# Patient Record
Sex: Female | Born: 1935 | Race: White | Hispanic: No | Smoking: Never smoker
Health system: Southern US, Community
[De-identification: ages and names within clinical notes are randomized; demographics above are authoritative.]

## PROBLEM LIST (undated history)

## (undated) DIAGNOSIS — G8929 Other chronic pain: Secondary | ICD-10-CM

## (undated) DIAGNOSIS — L853 Xerosis cutis: Secondary | ICD-10-CM

## (undated) DIAGNOSIS — Z8739 Personal history of other diseases of the musculoskeletal system and connective tissue: Secondary | ICD-10-CM

## (undated) DIAGNOSIS — I10 Essential (primary) hypertension: Secondary | ICD-10-CM

## (undated) DIAGNOSIS — M419 Scoliosis, unspecified: Secondary | ICD-10-CM

## (undated) DIAGNOSIS — E039 Hypothyroidism, unspecified: Secondary | ICD-10-CM

## (undated) DIAGNOSIS — R011 Cardiac murmur, unspecified: Secondary | ICD-10-CM

## (undated) DIAGNOSIS — C801 Malignant (primary) neoplasm, unspecified: Secondary | ICD-10-CM

## (undated) DIAGNOSIS — M1990 Unspecified osteoarthritis, unspecified site: Secondary | ICD-10-CM

## (undated) DIAGNOSIS — R1310 Dysphagia, unspecified: Secondary | ICD-10-CM

## (undated) DIAGNOSIS — M21371 Foot drop, right foot: Secondary | ICD-10-CM

## (undated) DIAGNOSIS — R32 Unspecified urinary incontinence: Secondary | ICD-10-CM

## (undated) DIAGNOSIS — M21372 Foot drop, left foot: Secondary | ICD-10-CM

## (undated) DIAGNOSIS — H532 Diplopia: Secondary | ICD-10-CM

## (undated) DIAGNOSIS — M25569 Pain in unspecified knee: Secondary | ICD-10-CM

## (undated) DIAGNOSIS — M51369 Other intervertebral disc degeneration, lumbar region without mention of lumbar back pain or lower extremity pain: Secondary | ICD-10-CM

## (undated) DIAGNOSIS — R269 Unspecified abnormalities of gait and mobility: Secondary | ICD-10-CM

## (undated) DIAGNOSIS — M47817 Spondylosis without myelopathy or radiculopathy, lumbosacral region: Secondary | ICD-10-CM

## (undated) DIAGNOSIS — K219 Gastro-esophageal reflux disease without esophagitis: Secondary | ICD-10-CM

## (undated) DIAGNOSIS — G63 Polyneuropathy in diseases classified elsewhere: Principal | ICD-10-CM

## (undated) DIAGNOSIS — R42 Dizziness and giddiness: Secondary | ICD-10-CM

## (undated) DIAGNOSIS — H409 Unspecified glaucoma: Secondary | ICD-10-CM

## (undated) DIAGNOSIS — K5900 Constipation, unspecified: Secondary | ICD-10-CM

## (undated) DIAGNOSIS — M25519 Pain in unspecified shoulder: Secondary | ICD-10-CM

## (undated) DIAGNOSIS — M4800 Spinal stenosis, site unspecified: Secondary | ICD-10-CM

## (undated) DIAGNOSIS — M5136 Other intervertebral disc degeneration, lumbar region: Secondary | ICD-10-CM

## (undated) HISTORY — DX: Foot drop, right foot: M21.371

## (undated) HISTORY — DX: Spondylosis without myelopathy or radiculopathy, lumbosacral region: M47.817

## (undated) HISTORY — DX: Other chronic pain: G89.29

## (undated) HISTORY — DX: Unspecified abnormalities of gait and mobility: R26.9

## (undated) HISTORY — PX: ESOPHAGEAL DILATION: SHX303

## (undated) HISTORY — DX: Unspecified osteoarthritis, unspecified site: M19.90

## (undated) HISTORY — PX: CATARACT EXTRACTION: SUR2

## (undated) HISTORY — DX: Pain in unspecified knee: M25.569

## (undated) HISTORY — DX: Spinal stenosis, site unspecified: M48.00

## (undated) HISTORY — DX: Polyneuropathy in diseases classified elsewhere: G63

## (undated) HISTORY — DX: Scoliosis, unspecified: M41.9

## (undated) HISTORY — PX: SHOULDER SURGERY: SHX246

## (undated) HISTORY — DX: Hypothyroidism, unspecified: E03.9

## (undated) HISTORY — DX: Foot drop, left foot: M21.372

## (undated) HISTORY — DX: Cardiac murmur, unspecified: R01.1

## (undated) HISTORY — DX: Pain in unspecified shoulder: M25.519

## (undated) HISTORY — PX: INGUINAL HERNIA REPAIR: SUR1180

## (undated) HISTORY — PX: DILATION AND CURETTAGE OF UTERUS: SHX78

---

## 1989-08-15 HISTORY — PX: TOTAL ABDOMINAL HYSTERECTOMY: SHX209

## 2008-01-05 ENCOUNTER — Inpatient Hospital Stay (HOSPITAL_COMMUNITY): Admission: RE | Admit: 2008-01-05 | Discharge: 2008-01-10 | Payer: Self-pay | Admitting: Orthopedic Surgery

## 2009-02-20 ENCOUNTER — Ambulatory Visit: Payer: Self-pay | Admitting: Internal Medicine

## 2009-02-20 ENCOUNTER — Encounter: Payer: Self-pay | Admitting: Internal Medicine

## 2009-02-20 ENCOUNTER — Inpatient Hospital Stay (HOSPITAL_COMMUNITY): Admission: EM | Admit: 2009-02-20 | Discharge: 2009-02-23 | Payer: Self-pay | Admitting: Emergency Medicine

## 2009-02-25 ENCOUNTER — Telehealth (INDEPENDENT_AMBULATORY_CARE_PROVIDER_SITE_OTHER): Payer: Self-pay

## 2009-02-25 ENCOUNTER — Encounter: Payer: Self-pay | Admitting: Internal Medicine

## 2009-03-23 ENCOUNTER — Encounter: Payer: Self-pay | Admitting: Internal Medicine

## 2009-05-09 ENCOUNTER — Inpatient Hospital Stay (HOSPITAL_COMMUNITY): Admission: EM | Admit: 2009-05-09 | Discharge: 2009-05-18 | Payer: Self-pay | Admitting: Emergency Medicine

## 2009-05-13 ENCOUNTER — Encounter: Payer: Self-pay | Admitting: Internal Medicine

## 2010-09-05 ENCOUNTER — Encounter: Payer: Self-pay | Admitting: Internal Medicine

## 2010-11-18 LAB — CBC
HCT: 36.3 % (ref 36.0–46.0)
Hemoglobin: 12.2 g/dL (ref 12.0–15.0)
Hemoglobin: 12.2 g/dL (ref 12.0–15.0)
RBC: 3.87 MIL/uL (ref 3.87–5.11)
RBC: 3.92 MIL/uL (ref 3.87–5.11)
RDW: 13.8 % (ref 11.5–15.5)
RDW: 14.4 % (ref 11.5–15.5)

## 2010-11-18 LAB — BASIC METABOLIC PANEL
Calcium: 9.2 mg/dL (ref 8.4–10.5)
Creatinine, Ser: 0.64 mg/dL (ref 0.4–1.2)
GFR calc Af Amer: >60 mL/min (ref >60)
GFR calc Af Amer: >60 mL/min (ref >60)
GFR calc non Af Amer: >60 mL/min (ref >60)
GFR calc non Af Amer: >60 mL/min (ref >60)
Glucose, Bld: 111 mg/dL — ABNORMAL HIGH (ref 70–99)
Glucose, Bld: 95 mg/dL (ref 70–99)
Potassium: 3.9 mEq/L (ref 3.5–5.1)
Sodium: 136 mEq/L (ref 135–145)
Sodium: 137 mEq/L (ref 135–145)

## 2010-11-18 LAB — DIFFERENTIAL
Basophils Absolute: 0 10*3/uL (ref 0.0–0.1)
Basophils Relative: 1 % (ref 0–1)
Lymphocytes Relative: 29 % (ref 12–46)
Lymphocytes Relative: 34 % (ref 12–46)
Lymphs Abs: 2.7 10*3/uL (ref 0.7–4.0)
Monocytes Absolute: 0.5 10*3/uL (ref 0.1–1.0)
Monocytes Absolute: 0.7 10*3/uL (ref 0.1–1.0)
Monocytes Relative: 8 % (ref 3–12)
Neutro Abs: 4.4 10*3/uL (ref 1.7–7.7)
Neutro Abs: 4.6 10*3/uL (ref 1.7–7.7)
Neutrophils Relative %: 62 % (ref 43–77)

## 2010-11-18 LAB — VANCOMYCIN, TROUGH: Vancomycin Tr: 5.4 ug/mL — ABNORMAL LOW (ref 10.0–20.0)

## 2010-11-19 LAB — COMPREHENSIVE METABOLIC PANEL
AST: 14 U/L (ref 0–37)
Albumin: 2.8 g/dL — ABNORMAL LOW (ref 3.5–5.2)
Albumin: 3.1 g/dL — ABNORMAL LOW (ref 3.5–5.2)
Alkaline Phosphatase: 51 U/L (ref 39–117)
BUN: 16 mg/dL (ref 6–23)
BUN: 20 mg/dL (ref 6–23)
Calcium: 9 mg/dL (ref 8.4–10.5)
Chloride: 99 mEq/L (ref 96–112)
Creatinine, Ser: 0.8 mg/dL (ref 0.4–1.2)
Creatinine, Ser: 0.93 mg/dL (ref 0.4–1.2)
GFR calc Af Amer: >60 mL/min (ref >60)
Glucose, Bld: 111 mg/dL — ABNORMAL HIGH (ref 70–99)
Potassium: 3.5 mEq/L (ref 3.5–5.1)
Total Bilirubin: 0.3 mg/dL (ref 0.3–1.2)
Total Bilirubin: 0.7 mg/dL (ref 0.3–1.2)
Total Protein: 6.1 g/dL (ref 6.0–8.3)
Total Protein: 6.9 g/dL (ref 6.0–8.3)

## 2010-11-19 LAB — BASIC METABOLIC PANEL
CO2: 27 mEq/L (ref 19–32)
Calcium: 8.9 mg/dL (ref 8.4–10.5)
Chloride: 103 mEq/L (ref 96–112)
Creatinine, Ser: 0.75 mg/dL (ref 0.4–1.2)
GFR calc Af Amer: >60 mL/min (ref >60)
GFR calc Af Amer: >60 mL/min (ref >60)
GFR calc non Af Amer: >60 mL/min (ref >60)
GFR calc non Af Amer: >60 mL/min (ref >60)
GFR calc non Af Amer: >60 mL/min (ref >60)
Glucose, Bld: 98 mg/dL (ref 70–99)
Potassium: 3.9 mEq/L (ref 3.5–5.1)
Potassium: 4.5 mEq/L (ref 3.5–5.1)
Sodium: 138 mEq/L (ref 135–145)
Sodium: 138 mEq/L (ref 135–145)

## 2010-11-19 LAB — CBC
HCT: 31.5 % — ABNORMAL LOW (ref 36.0–46.0)
HCT: 33.2 % — ABNORMAL LOW (ref 36.0–46.0)
HCT: 34.4 % — ABNORMAL LOW (ref 36.0–46.0)
HCT: 35.2 % — ABNORMAL LOW (ref 36.0–46.0)
Hemoglobin: 11.5 g/dL — ABNORMAL LOW (ref 12.0–15.0)
Hemoglobin: 11.8 g/dL — ABNORMAL LOW (ref 12.0–15.0)
Hemoglobin: 12.1 g/dL (ref 12.0–15.0)
MCHC: 34.5 g/dL (ref 30.0–36.0)
MCHC: 34.6 g/dL (ref 30.0–36.0)
MCV: 92.4 fL (ref 78.0–100.0)
MCV: 92.4 fL (ref 78.0–100.0)
MCV: 93.1 fL (ref 78.0–100.0)
Platelets: 207 10*3/uL (ref 150–400)
Platelets: 224 10*3/uL (ref 150–400)
Platelets: 323 10*3/uL (ref 150–400)
RBC: 3.57 MIL/uL — ABNORMAL LOW (ref 3.87–5.11)
RBC: 3.61 MIL/uL — ABNORMAL LOW (ref 3.87–5.11)
RBC: 3.79 MIL/uL — ABNORMAL LOW (ref 3.87–5.11)
RDW: 13.9 % (ref 11.5–15.5)
RDW: 13.9 % (ref 11.5–15.5)
RDW: 14.4 % (ref 11.5–15.5)
WBC: 10.7 10*3/uL — ABNORMAL HIGH (ref 4.0–10.5)
WBC: 8.5 10*3/uL (ref 4.0–10.5)
WBC: 8.8 10*3/uL (ref 4.0–10.5)

## 2010-11-19 LAB — DIFFERENTIAL
Basophils Absolute: 0 10*3/uL (ref 0.0–0.1)
Basophils Absolute: 0 10*3/uL (ref 0.0–0.1)
Basophils Absolute: 0 10*3/uL (ref 0.0–0.1)
Basophils Absolute: 0 10*3/uL (ref 0.0–0.1)
Basophils Relative: 0 % (ref 0–1)
Basophils Relative: 0 % (ref 0–1)
Basophils Relative: 0 % (ref 0–1)
Eosinophils Relative: 0 % (ref 0–5)
Eosinophils Relative: 1 % (ref 0–5)
Eosinophils Relative: 1 % (ref 0–5)
Lymphocytes Relative: 15 % (ref 12–46)
Lymphocytes Relative: 18 % (ref 12–46)
Lymphocytes Relative: 19 % (ref 12–46)
Lymphocytes Relative: 22 % (ref 12–46)
Lymphs Abs: 1.9 10*3/uL (ref 0.7–4.0)
Lymphs Abs: 2.1 10*3/uL (ref 0.7–4.0)
Monocytes Absolute: 0.8 10*3/uL (ref 0.1–1.0)
Monocytes Absolute: 0.9 10*3/uL (ref 0.1–1.0)
Monocytes Absolute: 1.1 10*3/uL — ABNORMAL HIGH (ref 0.1–1.0)
Monocytes Absolute: 1.3 10*3/uL — ABNORMAL HIGH (ref 0.1–1.0)
Monocytes Relative: 9 % (ref 3–12)
Neutro Abs: 7.2 10*3/uL (ref 1.7–7.7)
Neutro Abs: 7.5 10*3/uL (ref 1.7–7.7)
Neutro Abs: 9.3 10*3/uL — ABNORMAL HIGH (ref 1.7–7.7)
Neutrophils Relative %: 72 % (ref 43–77)
Neutrophils Relative %: 75 % (ref 43–77)

## 2010-11-19 LAB — C-REACTIVE PROTEIN: CRP: 17.8 mg/dL — ABNORMAL HIGH (ref <0.6)

## 2010-11-19 LAB — URINALYSIS, ROUTINE W REFLEX MICROSCOPIC
Bilirubin Urine: NEGATIVE
Nitrite: NEGATIVE
Protein, ur: 30 mg/dL — AB

## 2010-11-19 LAB — PROTIME-INR
INR: 1 (ref 0.00–1.49)
Prothrombin Time: 13.3 seconds (ref 11.6–15.2)

## 2010-11-19 LAB — BODY FLUID CULTURE

## 2010-11-19 LAB — APTT: aPTT: 42 seconds — ABNORMAL HIGH (ref 24–37)

## 2010-11-19 LAB — URINE CULTURE

## 2010-11-19 LAB — CULTURE, BLOOD (ROUTINE X 2)
Culture: NO GROWTH
Report Status: 10042010

## 2010-11-21 LAB — COMPREHENSIVE METABOLIC PANEL
AST: 25 U/L (ref 0–37)
Albumin: 3.7 g/dL (ref 3.5–5.2)
Alkaline Phosphatase: 50 U/L (ref 39–117)
BUN: 16 mg/dL (ref 6–23)
CO2: 25 mEq/L (ref 19–32)
Chloride: 101 mEq/L (ref 96–112)
Potassium: 3.3 mEq/L — ABNORMAL LOW (ref 3.5–5.1)
Total Bilirubin: 0.5 mg/dL (ref 0.3–1.2)

## 2010-11-21 LAB — CBC
HCT: 34.7 % — ABNORMAL LOW (ref 36.0–46.0)
HCT: 35 % — ABNORMAL LOW (ref 36.0–46.0)
HCT: 38.2 % (ref 36.0–46.0)
Hemoglobin: 12.1 g/dL (ref 12.0–15.0)
MCHC: 35 g/dL (ref 30.0–36.0)
Platelets: 190 10*3/uL (ref 150–400)
Platelets: 222 10*3/uL (ref 150–400)
RBC: 3.76 MIL/uL — ABNORMAL LOW (ref 3.87–5.11)
RBC: 4.18 MIL/uL (ref 3.87–5.11)
RDW: 15 % (ref 11.5–15.5)
WBC: 11.4 10*3/uL — ABNORMAL HIGH (ref 4.0–10.5)
WBC: 8.6 10*3/uL (ref 4.0–10.5)

## 2010-11-21 LAB — DIFFERENTIAL
Basophils Absolute: 0.1 10*3/uL (ref 0.0–0.1)
Basophils Relative: 1 % (ref 0–1)
Eosinophils Absolute: 0.1 10*3/uL (ref 0.0–0.7)
Eosinophils Relative: 1 % (ref 0–5)
Eosinophils Relative: 1 % (ref 0–5)
Lymphocytes Relative: 17 % (ref 12–46)
Lymphocytes Relative: 23 % (ref 12–46)
Lymphs Abs: 2 10*3/uL (ref 0.7–4.0)
Monocytes Absolute: 0.8 10*3/uL (ref 0.1–1.0)
Monocytes Absolute: 0.8 10*3/uL (ref 0.1–1.0)
Monocytes Relative: 8 % (ref 3–12)
Neutrophils Relative %: 67 % (ref 43–77)

## 2010-11-21 LAB — BASIC METABOLIC PANEL
BUN: 6 mg/dL (ref 6–23)
BUN: 7 mg/dL (ref 6–23)
CO2: 28 mEq/L (ref 19–32)
Calcium: 9.7 mg/dL (ref 8.4–10.5)
Chloride: 106 mEq/L (ref 96–112)
Chloride: 107 mEq/L (ref 96–112)
Creatinine, Ser: 0.73 mg/dL (ref 0.4–1.2)
Creatinine, Ser: 0.79 mg/dL (ref 0.4–1.2)
GFR calc Af Amer: >60 mL/min (ref >60)
GFR calc non Af Amer: >60 mL/min (ref >60)
GFR calc non Af Amer: >60 mL/min (ref >60)
Glucose, Bld: 117 mg/dL — ABNORMAL HIGH (ref 70–99)
Glucose, Bld: 98 mg/dL (ref 70–99)
Potassium: 3.4 mEq/L — ABNORMAL LOW (ref 3.5–5.1)
Potassium: 4 mEq/L (ref 3.5–5.1)
Sodium: 138 mEq/L (ref 135–145)

## 2010-11-21 LAB — HEPATIC FUNCTION PANEL
ALT: 11 U/L (ref 0–35)
Bilirubin, Direct: 0.1 mg/dL (ref 0.0–0.3)
Indirect Bilirubin: 0.4 mg/dL (ref 0.3–0.9)

## 2010-11-21 LAB — HEMOGLOBIN AND HEMATOCRIT, BLOOD
HCT: 34 % — ABNORMAL LOW (ref 36.0–46.0)
Hemoglobin: 11.6 g/dL — ABNORMAL LOW (ref 12.0–15.0)

## 2010-11-21 LAB — LIPID PANEL
Cholesterol: 170 mg/dL (ref 0–200)
HDL: 40 mg/dL (ref >39)
LDL Cholesterol: 103 mg/dL — ABNORMAL HIGH (ref 0–99)
Total CHOL/HDL Ratio: 4.3 RATIO
VLDL: 27 mg/dL (ref 0–40)

## 2010-11-21 LAB — SAMPLE TO BLOOD BANK

## 2010-12-28 NOTE — Consult Note (Signed)
NAMEDAVY, FAUGHT                ACCOUNT NO.:  [id number]   MEDICAL RECORD NO.:  [id number]          PATIENT TYPE:  INP   LOCATION:  A306                          FACILITY:  [facility]   PHYSICIAN:  R. Roetta Sessions, M.D. DATE OF BIRTH:  11/11/1924   DATE OF CONSULTATION:  02/20/2009  DATE OF DISCHARGE:                                 CONSULTATION   REASON FOR CONSULTATION:  Abdominal cramps and bloody diarrhea.   HISTORY OF PRESENT ILLNESS:  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  She has not had any  melena.  She was seen by Dr. Lynelle Doctor in the emergency department, where  she was evaluated.  She was found be hemodynamically stable.  She had no  stool in the rectal vault.  The CBC revealed a white count of 11.4, H  and H of 13.3 and 38.3, MCV 91.5, platelet count 222,000, INR 0.9.   [name] denies any prior similar symptoms.  She reported undergoing  her first ever screening colonoscopy 3 years ago by Dr. Angela Nevin up in  [place], was told that everything looked good.  She does take Excedrin  Migraine on occasion, ibuprofen for various aches and pains.  She does  have chronic gastroesophageal reflux disease for which she takes  Prilosec 20 mg orally daily.  She also describes intermittent esophageal  dysphagia to solids and has to take Tums two to three times weekly for  breakthrough reflux symptoms.  She has never had her upper GI tract  evaluated.  She does not use tobacco or alcohol.   PAST MEDICAL HISTORY:  Significant for left rotator cuff.  She underwent  surgery back in 2000 by Dr. Simonne Come, down in [place].  History of  hypertension, hypothyroidism.  Glaucoma.   PAST SURGERIES:  Right inguinal hernia  repair, left rotator cuff surgery  repair previously.  No history of gastrointestinal bleeding.  History of  cataract surgery.   MEDICATIONS ON ADMISSION:  Excedrin Migraine, ibuprofen p.r.n.,  Synthroid, Moexipril, hydrochlorothiazide, ophthalmic eye drops.   ALLERGIES:  No known drug allergies.   FAMILY HISTORY:  Negative for known GI or liver illness.   SOCIAL HISTORY:  Patient is a retired Public house manager.  She is widowed.  She has had  four children.  Two are deceased.  One died with leukemia.  The other  died with kidney failure.  No tobacco, no alcohol.  She lives in an  assisted living center in [place], [place].  She has two supportive  children.   REVIEW OF SYSTEMS:  No recent chest pain, dyspnea on exertion.  Reflux  symptoms and dysphagia as outlined above.  She denies any change in  weight recently.   PHYSICAL EXAMINATION:  GENERAL:  Pleasant, conversant, well-oriented  elderly lady, resting comfortably in her hospital bed.  VITAL SIGNS:  Temperature 98.6, pulse 81,  respiratory rate 18, BP  119/56, O2 saturation 98% on room air.  SKIN:  Warm and dry.  No jaundice or cutaneous stigmata of chronic liver  disease.  HEENT:  No scleral icterus.  Conjunctivae pink.  Oral cavity no lesions.  CHEST:  Lungs clear to auscultation.  CARDIAC EXAM:     Regular rate and rhythm without murmur, gallop or rub.  BREAST EXAM:  Deferred.  ABDOMEN:  Nondistended.  Positive bowel sounds, no bruits.  She has mild  diffuse tenderness to palpation.  No appreciable mass or organomegaly.  EXTREMITIES: No edema.  RECTAL:  Exam deferred to time of colonoscopy.   LABORATORY EVALUATION:  CBC as outlined above.  Sodium 134, potassium  2.3, chloride 101, CO2 25, glucose 122, BUN 16, creatinine 0.70, total  bilirubin 0.5, alkaline phosphatase 50, AST 25, ALT 16, albumin 3.7,  calcium 9.9.  INR 0.9.   IMPRESSION:  [name] is a very pleasant 77 year old lady,  admitted to the hospital with acute  illness, characterized by abdominal  cramps, followed by multiple bowel movements, followed by blood per  rectum.   She remains hemodynamically stable, has a normal initial hemoglobin  here.   Clinically, this scenario sounds very much like a segmental or ischemic  colitis, although certainly other diagnostic possibilities would exist.  It is good to know she had a reportedly negative colonoscopy just 3  years ago.   As a separate issue, she has longstanding gastroesophageal reflux  disease symptoms and has refractory symptoms to once daily PPI therapy  in the way of Prilosec.  She also describes esophageal dysphagia, has  not had her upper GI tract evaluated previously.   RECOMMENDATIONS:  I have told [name] we ought to go ahead and do a  colonoscopy today and determine the cause of her hematochezia.  Risks,  benefits, alternatives and limitations to this approach have been  reviewed.   At some point in the future, I have also offered [name] an EGD to  further evaluate her refractory reflux symptoms and esophageal  dysphagia.  This would be best accomplished at another time, perhaps in  the outpatient setting.  Her questions have been answered.  She is  agreeable.  We will make further recommendations in the very near  future.  We will plan to prep her and perform colonoscopy later today.      Jonathon Bellows, M.D.  Electronically Signed     RMR/MEDQ  D:  02/20/2009  T:  02/20/2009  Job:  [id number]   cc:   Dr. Beckey [facility]   [facility] Hospitalist Team

## 2010-12-28 NOTE — Discharge Summary (Signed)
[name], [name]                ACCOUNT NO.:  [id number]   MEDICAL RECORD NO.:  [id number]          PATIENT TYPE:  INP   LOCATION:  1504                         [facility]ACILITY:  [facility]   PHYSICIAN:  Marlowe Kays, M.D.  DATE OF BIRTH:  1924-10-15   DATE OF ADMISSION:  01/04/2008  DATE OF DISCHARGE:  01/09/2008                               DISCHARGE SUMMARY   ADMITTING DIAGNOSES:  1. Severe tear in the face of impingement of the rotator cuff of the      left shoulder.  2. 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  3. Hypothyroidism.  4. Status post bilateral corneal transplants, abdominal hernia repair,      hysterectomy and bladder tack, and left foot surgery.   DISCHARGE DIAGNOSES:  1. Severe tear in the face of impingement of the rotator cuff of the      left shoulder.  2. 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  3. Hypothyroidism.  4. Status post bilateral corneal transplants, abdominal hernia repair,      hysterectomy and bladder tack, and left foot surgery.   OPERATION:  On Jan 04, 2008, the patient underwent:  1. Anterior acromionectomy with repair of the torn rotator cuff      utilizing TissueMend graft.  2. Open distal clavicle resection left shoulder, with nurse an extra      tech assisting.   BRIEF HISTORY:  This patient had an injury to her left shoulder about 2  months ago.  MRI was performed in [place] on Dec 20, 2007 which showed a  rotator cuff tear with more than 3 cm of retraction.  She also had  subsequent atrophy to the supra- and infraspinatus muscles.  AC joint  arthritis was also seen.  She was seen by a physician in [place], and  she was told that her shoulder was not repairable.  She had heard of Dr.  Simonne Come through a friend and came for a second opinion.  Dr. Simonne Come  carefully reviewed all the MRIs, offered her several options, and she  decided to go ahead with repair of the rotator cuff of the left  shoulder.   This is a very independent one who maintains her own home.  She has a  granddaughter who lives with her, around 75 years of age.  She has been  extremely active she is a retired Engineer, civil (consulting) and is extremely aware of the  risks and benefits of this surgery, which were fully described by Dr.  Simonne Come .   We would like to keep her as independent as possible, and since the  shoulder is given her so much trouble, it was decided to go ahead with  the above procedure.   COURSE IN THE HOSPITAL:  The patient tolerated the surgical procedure  quite well.  She had a little difficulty in recovering from anesthesia.  PCA was given and then discontinued due the lingering effects of the  analgesic.  Occupational therapy saw the patient on the first postop  day, and the patient really did not want to participate due to the  extreme pain.  The occupational therapist as well as physical therapist  strongly recommended the patient have some sort of  rehabilitation/skilled nursing for her safety as she essentially is  alone at home (her granddaughter working and goes to school).  We  directed our efforts toward skilled nursing.  Apparently, there is a  skilled nursing facility around her home in [place].  I believe she  told me it was [facility].  She highly desires to go to this area,  and I mentioned in my notes today that when we could get these  arrangements done then she can be discharged.  This morning, she was  very frustrated and wanted to continue her life.   Examination this morning reveals that the shoulder immobilizer is in  place.  The dressing is dry.  Neurovascular is intact to the left upper  extremity.  Laboratory values in the hospital hematologically showed a  hematocrit and hemoglobin within normal limits.  Blood chemistries were  normal as well.  Chest x-ray showed no active disease.  Electrocardiogram showed sinus rhythm, with occasional premature atrial   contractions.   CONDITION ON DISCHARGE:  Stable.   PLAN:  The patient will be transferred to skilled nursing.  If some sort  of arrangements can be made in her home with adequate and even constant  care, then perhaps that might be an option, but at this point in time  skilled nursing/rehab is indicated.   As far as any physical therapy and occupational therapy, she is not to  be taken out of the shoulder abductor splint.  The elbow cannot be  brought to the side.  Absolutely NO range of motion of the left  shoulder.  Dry dressing to the wound is needed.   MEDICATIONS AT DISCHARGE:  1. Tylox or Vicodin 1-2 q.4-6 h. p.r.n. pain, and she prefers Extra      Strength Excedrin.  2. Hydrochlorothiazide 25 mg q.a.m.  3. Moexipril hydrochloride 7.5 mg q.a.m.  4. Synthroid 75 mcg q.a.m.  5. Azopt eye drops 1%, 1 drop each eye b.i.d.  6. Fish oil capsule Lovaza b.i.d.  7. Prilosec over the counter q.a.m.  8. Multivitamin, vitamin C, vitamin B12 daily.  9. Excedrin p.r.n.  10.Lescol 80 mg at bedtime.  11.Tylenol p.r.n. discomfort.   She is to return to see Dr. Simonne Come 2 weeks after date of surgery.  His phone number is [phone].  Please call for an appointment.  If you  have any orthopedic questions, please contact Dr. Simonne Come.  If you  have any medical problems, please contact her physician in [place].      [name].    ______________________________  Marlowe Kays, M.D.    DLU/MEDQ  D:  01/09/2008  T:  01/09/2008  Job:  [id number]

## 2010-12-28 NOTE — Discharge Summary (Signed)
NAMEVICTOR, [name]                ACCOUNT NO.:  [id number]   MEDICAL RECORD NO.:  [id number]          PATIENT TYPE:  INP   LOCATION:  A306                          FACILITY:  [facility]   PHYSICIAN:  Ashley Shipper, MD     DATE OF BIRTH:  [birth year]   DATE OF ADMISSION:  02/20/2009  DATE OF DISCHARGE:  07/12/2010LH                               DISCHARGE SUMMARY   PRIMARY MEDICAL DOCTOR:  [name] in [place], [place].   During this hospitalization, the patient was seen by [name],  gastroenterologist.   PROCEDURES UNDERGONE:  Colonoscopy with biopsy which revealed normal  rectum, elongated tortuous colon and left-sided diverticula.  She also  was noted to have a markedly inflamed segment of the colonic mucosa  straddling the splenic flexure consistent with segmental colitis or  ischemic colitis.   DISCHARGE DIAGNOSES:  1. Segmental/ischemic colitis with hematochezia, improved.  2. Abdominal pain, chronic etiology, unclear, possible irritable bowel      syndrome or related to constipation.  3. Cholelithiasis without evidence of cholecystitis.  4. History of hypertension.  5. History hypothyroidism.   Please see the H and P dictated at the time of admission for details  regarding patient's presenting illness.   BRIEF HOSPITAL COURSE:  Hematochezia.  This is an 77 year old Caucasian  female who presented to the hospital with complaints of blood in the  stool.  The patient was admitted to the hospital and she was seen by  Gastroenterology and underwent a colonoscopy as discussed above.  Segmental colitis is a self-limiting process.  The patient stopped  having blood in the stool.  Hemoglobin did not change too much.  She did  not require any transfusions.  Hemoglobin at presentation was 13.3 and  the last one we have is 12.0.  The patient was having a lot of abdominal  pain when I saw her yesterday, mostly in the right lower quadrant, so we  did a CT abdomen and pelvis which  revealed actually gallstones and pus,  and pericholecystic fluid and they felt it could be due to  cholecystitis.  She was also noted to have small hiatal hernia,  extensive atherosclerosis, however, no significant stenosis was noted in  the mesenteric vessels.  Mild thickening of the wall of the descending  colon was noted which was felt to be residual colitis, so I went back  and reevaluated the patient after the CT.  She does not have any right  lower quadrant tenderness.  LFTs are completely normal, so I think that  she does not have cholecystitis and the gallstones are coincidental  finding or rather incidental finding of the CT.  I have explained to her  that she does have gallstones and I have told her that if she develops  dyspeptic symptoms or if develops pain in the right upper quadrant then  she needs to talk to her doctor and get herself evaluated by a surgeon.  However, at this time she does not need surgery.   Rest of her medical issues including hypothyroidism and hypertension  remains stable.   This morning,  the patient feels well, she does not have any complaints.  She had 2 bowel movements with no blood in them.   PHYSICAL EXAMINATION:  VITAL SIGNS:  Stable.  Blood pressure is 138/84,  saturation 92% on room air.  LUNGS:  Clear to auscultation bilaterally with no wheezing, rales, or  rhonchi.  CARDIOVASCULAR:  S1, S2 is normal and regular.  ABDOMEN:  Soft, nontender, nondistended.  Bowel sounds are present.  No  masses or organomegaly is appreciated.   LABS:  This morning showed normal LFTs, normal potassium, normal  creatinine.   She has been ambulating with no difficulties.   DISCHARGE MEDICATIONS:  1. Azopt 1% one drop each eye twice daily.  2. HCTZ 25 mg daily.  3. Synthroid 75 mcg daily.  4. Lexapro 7.5 mg half a tablet daily.   No new medications have been prescribed.  She may take over-the-counter  stool softners if needed for constipation.    FOLLOWUP:  1. [name] will schedule followup with his office.  2. She needs to see her PMD next week.   DIET:  Heart healthy with high fiber.   PHYSICAL ACTIVITY:  No restrictions.   IMAGING STUDIES:  CT abdomen and pelvis as described above.   Total time in this encounter, 35 minutes.      Ashley Shipper, MD  Electronically Signed     GK/MEDQ  D:  02/23/2009  T:  02/23/2009  Job:  [id number]   cc:   [name], M.D.  [address]  [place]  [place] [address]   [name]  [place]

## 2010-12-28 NOTE — Discharge Summary (Signed)
NAMETENESSA, MARSEE NO.:  [id number]   MEDICAL RECORD NO.:  [id number]          PATIENT TYPE:  INP   LOCATION:  1504                         FACILITY:  [facility]   PHYSICIAN:  Marlowe Kays, M.D.  DATE OF BIRTH:  May 29, 1925   DATE OF ADMISSION:  01/04/2008  DATE OF DISCHARGE:                               DISCHARGE SUMMARY   ADDENDUM:  We continued the search for appropriate skilled nursing  facility for Ms. Eagleton.  Her daughter has apparently selected one  according to what Ms. Schnepf said this morning.  Hopefully, this  transition will be smooth.  She is relatively comfortable at this point  in time and has continued work with therapy.   She made Dr. Simonne Come and myself aware of the weakness that had been  noticeable in her lower extremities.  Apparently her family physician  had ordered physical therapy to strengthen her lower extremities.  This  was before the surgical procedure for which she was admitted.  It  certainly is understandable why she was slow with her ambulation and  activities of daily living postoperatively.  When she is transferred to  the skilled nursing/rehab we will need to continue with strengthening  and range of motion of the lower extremities so that she can return to  the level of independence and comfort that she had preoperatively.      [name].    ______________________________  Marlowe Kays, M.D.    DLU/MEDQ  D:  01/10/2008  T:  01/10/2008  Job:  [id number]

## 2010-12-28 NOTE — Discharge Summary (Signed)
[name], [name]                ACCOUNT NO.:  [id number]   MEDICAL RECORD NO.:  [id number]          PATIENT TYPE:  INP   LOCATION:  1504                         [facility]ACILITY:  [facility]   PHYSICIAN:  Marlowe Kays, M.D.  DATE OF BIRTH:  1925-01-22   DATE OF ADMISSION:  01/04/2008  DATE OF DISCHARGE:  01/09/2008                               DISCHARGE SUMMARY   ADDENDUM:  In addition to the medications previously dictated, [name] also has  Robaxin 500 mg 1 q.6 h. p.r.n. muscle spasm.  We also want to encourage  her to relax the musculature of the trapezius muscle on the left side.  Also to note that the pillow part of the shoulder immobilizer abductor  splint, the concave side of the pillow, should be against her body to  support the arm/shoulder away from the body.      [name].    ______________________________  Marlowe Kays, M.D.    DLU/MEDQ  D:  01/09/2008  T:  01/09/2008  Job:  [id number]

## 2010-12-28 NOTE — Op Note (Signed)
[name], [name]                ACCOUNT NO.:  [id number]   MEDICAL RECORD NO.:  [id number]          PATIENT TYPE:  INP   LOCATION:  A306                          FACILITY:  [facility]   PHYSICIAN:  R. Roetta Sessions, M.D. DATE OF BIRTH:  05-26-1925   DATE OF PROCEDURE:  02/20/2009  DATE OF DISCHARGE:                               OPERATIVE REPORT   PROCEDURE:  Colonoscopy with biopsy.   INDICATIONS FOR PROCEDURE:  An 77 year old lady admitted hospital after  acute illness characterized by abdominal cramps, diarrhea, and blood per  rectum.  She remains hemodynamically stable.  Colonoscopy is now being  performed.  Risks, benefits, alternatives, and limitations have been  discussed and questions answered.  Please see the documentation in the  medical record.   PROCEDURE NOTE:  O2 saturation, blood pressure, pulse, and respiration  were monitored the entire procedure.   CONSCIOUS SEDATION:  Versed 4 mg IV and Demerol 100 mg IV in divided  doses.   INSTRUMENT:  Pentax video chip system.   FINDINGS:  Digital rectal exam revealed no abnormalities.   ENDOSCOPIC FINDINGS:  The prep was suboptimal but doable.   Colon:  Colonic mucosa was surveyed from the rectosigmoid junction  through the left transverse right colon to the appendiceal orifice,  ileocecal valve, and cecum.  These structures were well seen and  photographed for the record.  From this level, the scope was slowly and  cautiously withdrawn.  All previously mentioned mucosal surfaces were  again seen.  The patient a long tortuous colon.  The patient had left-  sided diverticula.   The patient had a markedly abnormal segment of colon, perhaps 20-30 cm  in length, straddling the splenic flexure characterized by  fibrotic-  appearing, eroded, ulcerated mucosa.  There were confluent inflammatory  changes with some areas of satellite inflammation trailing off  distally and proximally with the more proximal colon to the cecum,  otherwise appearing normal, and the more distal descending and sigmoid  segments normal aside from the previously mentioned diverticula.  Biopsies were taken.  The scope was pulled down into the rectum where  examination of the rectal mucosa was undertaken.  The rectal mucosa  appeared normal.  I attempted to retroflex but was unable to do so.  The  patient tolerated the procedure well.  Cecal withdrawal time:  7  minutes.   IMPRESSION:  1. Normal rectum.  2. Somewhat elongated tortuous colon and left-sided diverticula.  3. Markedly inflamed segment of colonic mucosa as described above      straddling the splenic flexure consistent with ischemic or      segmental colitis which is a self-limiting illness and almost never      recurs.   RECOMMENDATIONS:  1. Clear liquid diet today.  Advance to a low-residue diet starting      tomorrow.  2. Follow up on path.  3. I recommended Ms. Crail come back electively for an outpatient EGD      with esophageal dilation as appropriate to further evaluate and      manage her refractory reflux symptoms  and solid food dysphagia.   I would like to thank the hospitalist service for allowing me to see  this nice lady today.      Jonathon Bellows, M.D.  Electronically Signed     RMR/MEDQ  D:  02/20/2009  T:  02/20/2009  Job:  [id number]   cc:   Carlota Raspberry  Fax: [phone]

## 2010-12-28 NOTE — Op Note (Signed)
[name], [name]                ACCOUNT NO.:  [id number]   MEDICAL RECORD NO.:  [id number]          PATIENT TYPE:  AMB   LOCATION:  DAY                          FACILITY:  [facility]   PHYSICIAN:  Marlowe Kays, M.D.  DATE OF BIRTH:  May 14, 1925   DATE OF PROCEDURE:  01/04/2008  DATE OF DISCHARGE:                               OPERATIVE REPORT   PREOPERATIVE DIAGNOSES:  1. Torn rotator cuff.  2. Osteoarthritis, acromioclavicular joint, left shoulder.   POSTOPERATIVE DIAGNOSES:  1. Torn rotator cuff.  2. Osteoarthritis, acromioclavicular joint, left shoulder.   OPERATION:  1. Anterior acromionectomy with repair of torn rotator cuff utilizing      TissueMend graft.  2. Open distal clavicle resection, left shoulder.   SURGEON:  [place].   ASSISTANT:  Nurse and extra tech.   PATHOLOGY/JUSTIFICATION FOR PROCEDURE:  Originally injury with roughly  two months ago.  She had an MRI on Dec 20, 2007 which demonstrated a  rotator cuff tear with more than 3 cm of retraction, atrophy of supra  and infraspinatus muscles, and the AC joint arthritis.  She was seen  locally in [place] and told the repair was not fixable prompting her to  come see me for a second opinion.  The pain is a major problem, driving  her to have an attempt at repair today.  I explained preoperatively that  the quality of the repair would depend on the quality of the rotator  cuff tissue and as well as its flexibility.   PROCEDURE:  Prophylactic antibiotics, satisfactory general anesthesia,  beach-chair position on the Graham frame.  Left shoulder girdle was  prepped with DuraPrep, draped in a sterile field.  Time-out performed.  Ioban utilized.  I made an incision over the distal clavicle and then  curved vertically downward over the proximal humerus, first isolating  the distal clavicle and AC joint. I measured out a line for osteotomy at  about a centimeter and a half from the [facility] joint.  Protecting the  underlying tissues with baby Bennett's, I amputated the bone at this  level with a micro saw and moved the cut fragment with a towel clip and  cautery technique.  Bone wax was placed over the distal clavicle raw  bone.  I then extended the incision lateral ward and dissected the  fascia over the anterior acromion with cutting cautery.  I undermined  the anterior acromion, and joint fluid came forth confirming the tear.  My initial anterior acromionectomy was obliquely in usual fashion back  to the [facility] joint.  Subsequently, as need arose, I removed more anterior  bone and also some more bone at the [facility] joint for better visualization of  a rotator cuff tear.  Rotator cuff tissue was thin and not very  flexible.  There was a large anterior flap, very small posterior flap in  the middle, not much remaining except some fragmented strands.  I first  attempted to separate out the rotator cuff tissue from the overlying  tissue and acromion.  I then piecemealed together the midportion of the  rotator cuff strands  with multiple interrupted #1 Ethibond.  I then used  first of 2 Stryker anchors, the first one being 5.5 mm anteriorly and  wove four strands of the anchor through the large anterior flap.  I  cinched this down with the arm abducted on the Mayo stand and then tried  to work posteriorly.  The first 5.5 anchor pulled completely out of her  very soft humeral head.  We went and used a 6.5 Stryker anchor which did  seemed to hold securely.  I then worked from posteriorly toward the  middle, cinching down the small posterior flap which was not very  movable.  Finally, I then tried to blend the middle pieces together by  tying sutures through the posterior middle flaps together.  This was way  up at the [facility] joint.  From the anterior flap, I also through the sutures  and placed them through soft tissue over the lateral humerus which gave  some lateral buttress.  The posterior half of the rotator cuff,  I was  unable to return to its normal anatomic position, even with the arm  abducted.  I elected to use a TissueMend graft over this area and  beneath the acromion in the middle third of the fragmented flaps or  strands.  There was no place to suture the graft, so after smoothing  down, I took some triple-ply Surgicel and placed it down on top of the  graft, and this seemed to stabilize it nicely and also smoothed down the  overlying sutures.  I then irrigated the wound well with sterile saline  and infiltrated the soft tissues with 0.50% Marcaine with adrenaline.  Between the TissueMend graft and the Surgicel, no Gelfoam was required  to fill the gap at the distal clavicle resection site.  The wound was  then closed in layers with interrupted #1 Vicryl with the fascia over  the distal clavicle and acromion, subcutaneous tissue with a combination  of 2-0 and 4-0 Vicryl with Steri-Strips on skin, dry sterile dressing  and shoulder immobilizer with a bolster beneath it to hold the arm in  abduction were applied.  She tolerated the procedure well and was taken  to recovery room in satisfactory condition with no known complications.           ______________________________  Marlowe Kays, M.D.     JA/MEDQ  D:  01/04/2008  T:  01/04/2008  Job:  [id number]

## 2010-12-28 NOTE — H&P (Signed)
NAMEJOSEPH, JOHNS                ACCOUNT NO.:  [id number]   MEDICAL RECORD NO.:  [id number]          PATIENT TYPE:  INP   LOCATION:  A306                          FACILITY:  [facility]   PHYSICIAN:  Osvaldo Shipper, MD     DATE OF BIRTH:  1925-05-26   DATE OF ADMISSION:  02/20/2009  DATE OF DISCHARGE:  LH                              HISTORY & PHYSICAL   The patient's PMD is located in [place].   ADMISSION DIAGNOSES:  1. Hematochezia.  2. History of hypertension.  3. History of hyperthyroidism.   CHIEF COMPLAINT:  Blood in the stool.   HISTORY OF PRESENT ILLNESS:  The patient is a 77 year old Caucasian  female, who was in her usual state of health until last night and she  noticed bloody diarrhea.  She was complaining of lot of abdominal  cramping.  She denied any black stool per se.  The patient denies  similar symptoms in the past.  She had some cramping as I mentioned, but  no real pain.  Denies any recent episodes of low blood pressure.  Denies  any fever or chills.  The patient actually is quite somnolent because of  sedation, hence history is limited.  She denies any nausea or vomiting.   ALLERGIES:  No known drug allergies.   MEDICATIONS AT HOME:  1. Azopt eye drops each eye twice daily.  2. HCTZ 25 mg daily.  3. Synthroid 75 mcg daily.  4. Moexipril 7.5 mg tablets half tablet daily.   PAST MEDICAL HISTORY:  Positive for left rotator cuff injury, status  post surgery.  History of hypertension, hypothyroidism, and glaucoma.  She had a right inguinal hernia repair, left rotator cuff surgery.  No  history of any GI bleedings in the past, history of quite cataract  surgeries.   SOCIAL HISTORY:  She is a retired Engineer, civil (consulting).  She currently resides in an  assisted-living facility in [place].  No known history of  smoking or alcohol use.  No illicit drug use.   FAMILY HISTORY:  Based on previous records negative for any GI issues as  a history of leukemia,  kidney failure.   REVIEW OF SYSTEMS:  Unable to do at this time because of her somnolence.   PHYSICAL EXAMINATION:  VITAL SIGNS:  Temperature 98.6, blood pressure  119/56, heart rate 81, respiratory rate 18, saturation 98% on room air.  GENERAL:  An elderly white female in no distress, somnolent, arousable.  HEENT:  There is no pallor.  No icterus.  Oral mucous membranes moist.  No oral lesions are noted.  NECK:  Soft and supple.  No thyromegaly is appreciated.  LUNGS:  Clear to auscultation bilaterally.  No wheezing, rales, or  rhonchi.  CARDIOVASCULAR:  S1 and S2 is normal, regular.  No murmurs appreciated.  No S3 and S4.  No rubs.  No bruits.  No edema.  No JVD.  ABDOMEN:  Soft, nontender, nondistended.  Bowel sounds are present.  No  masses or organomegaly is appreciated.  EXTREMITIES:  Showed no edema.  MUSCULOSKELETAL:  Unremarkable.  NEUROLOGIC:  She is somnolent, arousable.  No focal neurological  deficits are present.   LABORATORY DATA:  White count is 7.4, hemoglobin is 13.3, platelet count  is 222.  PT INR, PTT is normal.  Potassium is 3.3.  Sodium 34, glucose  122.   No imaging studies have been done.   ASSESSMENT:  This is a 77 year old Caucasian female, who comes in with  hematochezia.  Differential diagnosis at this time include diverticular  bleeding colitis.  She is hemodynamically stable at this time.  1. Hematochezia.  We will admit the patient to hospital.  We will      check hemoglobin once again this afternoon and then tomorrow      morning.  We will transfuse as needed.  GI will be consulted.  I      think they have already seen her and the plan is for colonoscopy.      Depending on the results of the colonoscopy, further evaluations      may be required.  If there is consideration for colitis, she might      require CT angio of her abdomen, pelvis to look for vascular      compromise.  But, we will consider this in discussions with GI once      the  patient has had the colonoscopy.  2. History of hypertension.  Hold off on HCTZ, continue with      moexipril.  3. History of hypothyroidism.  Continue with Synthroid.  4. History of glaucoma.  Continue with eye drops.   The patient is a full code.   DVT prophylaxis with sequential compressive devices.   Further management decisions will depend on results of further testing  and patient's response to treatment.      Osvaldo Shipper, MD  Electronically Signed     GK/MEDQ  D:  02/20/2009  T:  02/21/2009  Job:  [id number]   cc:   R. Roetta Sessions, M.D.  [address]  [place]  [place] [address]

## 2010-12-31 NOTE — H&P (Signed)
[name], [name]                ACCOUNT NO.:  [id number]   MEDICAL RECORD NO.:  [id number]          PATIENT TYPE:  INP   LOCATION:  1504                         FACILITY:  [facility]   PHYSICIAN:  [name], M.D.  DATE OF BIRTH:  June 12, 1925   DATE OF ADMISSION:  01/04/2008  DATE OF DISCHARGE:  01/10/2008                              HISTORY & PHYSICAL   This history and physical is dictated from the records in the chart.   CHIEF COMPLAINT:  Pain in left shoulder.   PRESENT ILLNESS:  This is an 77 year old white female who had an injury  to the left shoulder about 2 months prior to admission.  An MRI was  performed in [name] on Dec 20, 2007, showing a rotator cuff tear with  more than 3 cm of retraction.  She was evaluated with subsequent atrophy  of the supra- and infraspinatus musculature, also some AC joint  arthritis.  She was told by the physicians in [name] that her shoulder  was not repairable.  A friend mentioned Dr. Leim Hood name, and she  came to [place] for a second opinion.  After careful reviewing of the MRIs,  several options were offered. and she decided to go ahead with repair of  the rotator cuff of the left shoulder.   She is a very independent lady, manages her own home.  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  These were fully  described by Dr. Simonne Hood.   PAST MEDICAL HISTORY:  The patient has hypertension, hypothyroidism.  She also has occasional reflux.   CURRENT MEDICATIONS:  1. Hydrochlorothiazide 25 mg q.a.m.  2. Moexipril hydrochloride 7.5 mg q.a.m.  3. Synthroid 75 mcg q.a.m.  4. Azopt eye drops 1% 1 drop each eye b.i.d.  5. Fish oil capsule.  6. Lovaza b.i.d.  7. Prilosec over-the-counter q.a.m.  8. Multivitamin, vitamin C, vitamin B12 daily.  9. Excedrin on an as-needed basis.  10.Lescol 80 mg at bedtime.  11.Tylenol p.r.n. discomfort.   PREVIOUS SURGERIES:  Abdominal hernia repair in 1986, hysterectomy  and  bladder tack, left foot surgery, bilateral cataract extraction and D&Cs.   ALLERGIES:  No known medical allergies.   SOCIAL HISTORY:  The patient has no intake of alcohol or tobacco  products.   FAMILY HISTORY:  Noncontributory.   REVIEW OF SYSTEMS:  CNS:  No seizure disorder, paralysis, numbness,  double vision.  RESPIRATORY:  No productive cough, no hemoptysis, no  shortness of breath.  CARDIOVASCULAR:  No chest pain, no angina or  orthopnea.  She does recall having a stress test about 6 years ago for  baseline.  GENITOURINARY:  No dysuria or hematuria but does have  nocturia and some mild incontinence.   PHYSICAL EXAMINATION:  GENERAL:  Alert, cooperative, friendly 83-year-  old white female.  VITAL SIGNS:  Blood pressure 137/77, pulse of 70, respirations 16,  temperature 98.4.  HEENT:  Normocephalic.  PERRLA.  Oropharynx is clear.  CHEST:  Clear to auscultation.  No rhonchi, no rales, no wheezes.  HEART:  Regular rate and  rhythm.  No murmurs are heard.  ABDOMEN:  Soft, nontender.  Liver and spleen not felt.  GENITALIA/RECTAL:  Not done, not pertinent to present illness.  EXTREMITIES:  The patient has pain with range of motion of the shoulder,  particularly internal and external rotation, and difficulty raising the  arm against gravity.   ADMITTING DIAGNOSES:  1. Severe tear in the face of impingement of the rotator cuff of the      left shoulder.  2. Osteoarthritis, acromioclavicular joint of the left shoulder.  3. Hypothyroidism.  4. Status post bilateral corneal transplants, abdominal hernia repair,      hysterectomy, bladder tack and left foot surgery.   PLAN:  The patient will be admitted for open repair of the rotator cuff  of the left shoulder and distal clavicle resection.      [name].    ______________________________  [name], M.D.    DLU/MEDQ  D:  01/30/2008  T:  01/30/2008  Job:  [id number]   cc:   [name], M.D.   Fax: [phone]

## 2011-05-11 LAB — BASIC METABOLIC PANEL
GFR calc non Af Amer: 59 — ABNORMAL LOW
Glucose, Bld: 114 — ABNORMAL HIGH
Potassium: 3.7
Sodium: 139

## 2011-05-11 LAB — HEMOGLOBIN AND HEMATOCRIT, BLOOD: Hemoglobin: 13.7

## 2011-11-14 ENCOUNTER — Ambulatory Visit
Admission: RE | Admit: 2011-11-14 | Discharge: 2011-11-14 | Disposition: A | Discharge status: Home / Self Care | Payer: Medicare Other | Source: Ambulatory Visit | Attending: Orthopedic Surgery | Admitting: Orthopedic Surgery

## 2011-11-14 ENCOUNTER — Other Ambulatory Visit: Payer: Self-pay | Admitting: Orthopedic Surgery

## 2011-11-14 DIAGNOSIS — M47817 Spondylosis without myelopathy or radiculopathy, lumbosacral region: Secondary | ICD-10-CM

## 2014-01-01 ENCOUNTER — Encounter: Payer: Self-pay | Admitting: Neurology

## 2014-01-02 ENCOUNTER — Encounter (INDEPENDENT_AMBULATORY_CARE_PROVIDER_SITE_OTHER): Payer: Self-pay

## 2014-01-02 ENCOUNTER — Encounter: Payer: Self-pay | Admitting: Neurology

## 2014-01-02 ENCOUNTER — Ambulatory Visit (INDEPENDENT_AMBULATORY_CARE_PROVIDER_SITE_OTHER): Payer: Medicare Other | Admitting: Neurology

## 2014-01-02 VITALS — BP 107/68 | HR 83 | Ht 63.0 in | Wt 147.5 lb

## 2014-01-02 DIAGNOSIS — R6889 Other general symptoms and signs: Secondary | ICD-10-CM

## 2014-01-02 DIAGNOSIS — R269 Unspecified abnormalities of gait and mobility: Secondary | ICD-10-CM

## 2014-01-02 DIAGNOSIS — G63 Polyneuropathy in diseases classified elsewhere: Secondary | ICD-10-CM

## 2014-01-02 DIAGNOSIS — D518 Other vitamin B12 deficiency anemias: Secondary | ICD-10-CM

## 2014-01-02 HISTORY — DX: Polyneuropathy in diseases classified elsewhere: G63

## 2014-01-02 MED ORDER — GABAPENTIN 100 MG PO CAPS: 100.0000 mg | ORAL_CAPSULE | Freq: Three times a day (TID) | ORAL | Status: DC

## 2014-01-02 NOTE — Progress Notes (Signed)
[employer], PA  - Please enter preop orders in epic for Riyan Gavina - surg date is 6/9.  Thanks

## 2014-01-02 NOTE — Progress Notes (Addendum)
Reason for visit: Peripheral neuropathy  [name] is a 80 y.o. female  History of present illness:  [name] is an 80 year old right-handed white female with a history of a peripheral neuropathy that was diagnosed around 2000. The patient had nerve conduction studies that showed evidence of a significant peripheral neuropathy at that time. The patient initially had numbness of the feet, but within the last year, she has developed some discomfort in the feet that is worse at night, better during the day. On the right side, the discomfort may go up above the knee, on the left side above the ankle. The patient has a gait disorder, in part related to degenerative arthritis involving the knees. The patient is planning a knee surgery on the right knee within the next several weeks. The patient has low back pain at times. She also notes some neck discomfort, and she has bilateral shoulder disease. The patient walks with a walker, and she has not fallen recently. The patient has been given gabapentin 300 mg capsules previously but she could not tolerate this, and she was given low-dose Cymbalta, but she did not take this 20 mg capsule because of potential side effects. The patient is sent to this office for an evaluation.  Past Medical History  Diagnosis Date  . Spinal stenosis   . Spondylosis of lumbosacral region   . Scoliosis   . Shoulder pain   . Osteoarthritis     both knees  . Knee pain     bilateral  . Heart murmur   . Hypothyroidism   . Chronic pain   . Polyneuropathy in other diseases classified elsewhere 01/02/2014    Past Surgical History  Procedure Laterality Date  . Oophorectomy    . Inguinal hernia repair    . Cataract extraction      bilateral  . Total abdominal hysterectomy    . Dilation and curettage of uterus      Family History  Problem Relation Age of Onset  . CVA Mother   . CVA Father   . Cancer Brother     Social history:  reports that she has never  smoked. She has never used smokeless tobacco. She reports that she does not drink alcohol or use illicit drugs.  Medications:  Current Outpatient Prescriptions on File Prior to Visit  Medication Sig Dispense Refill  . aspirin-acetaminophen-caffeine (EXCEDRIN EXTRA STRENGTH) 250-250-65 MG per tablet Take 1 tablet by mouth every 6 (six) hours as needed for headache.      . brinzolamide (AZOPT) 1 % ophthalmic suspension Place 1 drop into both eyes 3 (three) times daily.      . calcium carbonate (OS-CAL) 600 MG TABS tablet Take 600 mg by mouth 2 (two) times daily with a meal.      . docusate sodium (COLACE) 100 MG capsule Take 100 mg by mouth 2 (two) times daily.      . Garlic 284 MG TABS Take by mouth.      . hydrochlorothiazide (HYDRODIURIL) 25 MG tablet Take 12.5 mg by mouth daily.       Marland Kitchen levothyroxine (SYNTHROID, LEVOTHROID) 75 MCG tablet Take 75 mcg by mouth daily before breakfast.      . moexipril (UNIVASC) 7.5 MG tablet Take 7.5 mg by mouth at bedtime.      . Multiple Vitamins-Minerals (CENTRUM SILVER ADULT 50+ PO) Take 1 tablet by mouth daily.      Marland Kitchen omeprazole (PRILOSEC) 20 MG capsule Take 20 mg  by mouth daily.      . polyethylene glycol (MIRALAX / GLYCOLAX) packet Take 17 g by mouth daily.      . traMADol (ULTRAM) 50 MG tablet Take 50 mg by mouth every 6 (six) hours as needed.      . vitamin B-12 (CYANOCOBALAMIN) 1000 MCG tablet Take 1,000 mcg by mouth daily.       No current facility-administered medications on file prior to visit.     No Known Allergies  ROS:  Out of a complete 14 system review of symptoms, the patient complains only of the following symptoms, and all other reviewed systems are negative.  Heart murmur, swelling in the legs Shortness of breath Constipation Joint pain, joint swelling, achy muscles Numbness Not enough sleep Restless legs  Blood pressure 107/68, pulse 83, height 5\' 3"  (1.6 m), weight 147 lb 8 oz (66.906 kg).  Physical Exam  General: The  patient is alert and cooperative at the time of the examination.  Eyes: Pupils are equal, round, and reactive to light. Discs are flat bilaterally.  Neck: The neck is supple, no carotid bruits are noted.  Respiratory: The respiratory examination is clear.  Cardiovascular: The cardiovascular examination reveals a regular rate and rhythm, no obvious murmurs or rubs are noted.  Neuromuscular: The patient has incomplete abduction of the shoulders bilaterally, able to elevate to about 45 bilaterally.  Skin: Extremities are with 1+ edema at the ankles bilaterally.  Neurologic Exam  Mental status: The patient is alert and oriented x 3 at the time of the examination. The patient has apparent normal recent and remote memory, with an apparently normal attention span and concentration ability.  Cranial nerves: Facial symmetry is present. There is good sensation of the face to pinprick and soft touch bilaterally. The strength of the facial muscles and the muscles to head turning and shoulder shrug are normal bilaterally. Speech is well enunciated, no aphasia or dysarthria is noted. Extraocular movements are full. Visual fields are full. The tongue is midline, and the patient has symmetric elevation of the soft palate. No obvious hearing deficits are noted.  Motor: The motor testing reveals 5 over 5 strength of all 4 extremities. Good symmetric motor tone is noted throughout.  Sensory: Sensory testing is intact to pinprick, soft touch, vibration sensation, and position sense on the upper extremities. With the lower extremities, the patient has a stocking pattern pinprick sensory deficit to the knees bilaterally. Significant impairment of vibration and position sense is noted in both feet. No evidence of extinction is noted.  Coordination: Cerebellar testing reveals good finger-nose-finger and heel-to-shin bilaterally.  Gait and station: The patient requires assistance with standing. Once up, she can  walk with assistance, with a wide-based gait. Tandem gait was not attempted. Romberg is positive.  Reflexes: Deep tendon reflexes are symmetric, but are depressed bilaterally. Toes are downgoing bilaterally.   Assessment/Plan:  1. History of peripheral neuropathy  2. Gait disorder  3. Degenerative arthritis, knees  The patient has a clinical examination consistent with a peripheral neuropathy. The patient will be sent for blood work today, and she will be set up for nerve conduction studies of both legs, and one arm. EMG evaluation will be done on the right leg. The patient will followup for the EMG evaluation. The patient will be given a prescription for gabapentin at the 100 mg dosing, taking 1 capsule 3 times daily. If this is not effective, the dose may be increased. In the future, Cymbalta may be  used. The patient was given a prescription for a hospital bed at home. The patient has severe degenerative arthritis of the knees and legs, requires positioning with flexion, elevation of the head.  Jill Alexanders MD 01/02/2014 8:53 PM  [facility] [address] [place], [place] [address]  Phone [phone] Fax [phone]

## 2014-01-02 NOTE — Patient Instructions (Signed)

## 2014-01-03 ENCOUNTER — Telehealth: Payer: Self-pay | Admitting: Neurology

## 2014-01-03 DIAGNOSIS — M1990 Unspecified osteoarthritis, unspecified site: Secondary | ICD-10-CM

## 2014-01-03 DIAGNOSIS — R269 Unspecified abnormalities of gait and mobility: Secondary | ICD-10-CM

## 2014-01-03 NOTE — Telephone Encounter (Signed)
Patient's son called and stated he took order for electric bed to [facility] yesterday in [place] and additional information is needed.  Home health center states order should read Electric bed with half reels, and need OV notes to show the need for the bed.  Please fax new order to [phone].

## 2014-01-03 NOTE — Telephone Encounter (Signed)
I will redo the order for the bed.

## 2014-01-07 ENCOUNTER — Telehealth: Payer: Self-pay | Admitting: Neurology

## 2014-01-07 LAB — TSH: TSH: 0.773 u[IU]/mL (ref 0.450–4.500)

## 2014-01-07 LAB — IFE AND PE, SERUM
ALBUMIN/GLOB SERPL: 1.5 (ref 0.7–2.0)
Albumin SerPl Elph-Mcnc: 3.7 g/dL (ref 3.2–5.6)
Alpha 1: 0.2 g/dL (ref 0.1–0.4)
Alpha2 Glob SerPl Elph-Mcnc: 0.8 g/dL (ref 0.4–1.2)
B-Globulin SerPl Elph-Mcnc: 1.1 g/dL (ref 0.6–1.3)
GLOBULIN, TOTAL: 2.6 g/dL (ref 2.0–4.5)
Gamma Glob SerPl Elph-Mcnc: 0.4 g/dL — ABNORMAL LOW (ref 0.5–1.6)
IGA/IMMUNOGLOBULIN A, SERUM: 260 mg/dL (ref 91–414)
IGM (IMMUNOGLOBULIN M), SRM: 22 mg/dL — AB (ref 40–230)
IgG (Immunoglobin G), Serum: 537 mg/dL — ABNORMAL LOW (ref 700–1600)
TOTAL PROTEIN: 6.3 g/dL (ref 6.0–8.5)

## 2014-01-07 LAB — VITAMIN B12: VITAMIN B 12: 1916 pg/mL — AB (ref 211–946)

## 2014-01-07 LAB — ENA+DNA/DS+SJORGEN'S
ENA RNP AB: 2.1 AI — AB (ref 0.0–0.9)
ENA SSA (RO) Ab: <0.2 AI (ref 0.0–0.9)
ENA SSB (LA) Ab: <0.2 AI (ref 0.0–0.9)

## 2014-01-07 LAB — ANGIOTENSIN CONVERTING ENZYME: Angio Convert Enzyme: 16 U/L (ref 14–82)

## 2014-01-07 LAB — ANA W/REFLEX: ANA: POSITIVE — AB

## 2014-01-07 LAB — RHEUMATOID FACTOR: Rhuematoid fact SerPl-aCnc: <7 IU/mL (ref 0.0–13.9)

## 2014-01-07 NOTE — Telephone Encounter (Signed)
I called patient. The blood work was relatively unremarkable, with a name was positive with a low titer elevation in the Rnp antibody. I do not think this has any clinical significance. I discussed this with the patient.

## 2014-01-10 ENCOUNTER — Encounter (HOSPITAL_COMMUNITY): Payer: Self-pay | Admitting: Pharmacy Technician

## 2014-01-14 ENCOUNTER — Encounter (HOSPITAL_COMMUNITY): Payer: Self-pay

## 2014-01-14 ENCOUNTER — Encounter (HOSPITAL_COMMUNITY)
Admission: RE | Admit: 2014-01-14 | Discharge: 2014-01-14 | Disposition: A | Discharge status: Home / Self Care | Payer: Medicare Other | Source: Ambulatory Visit | Attending: Orthopedic Surgery | Admitting: Orthopedic Surgery

## 2014-01-14 ENCOUNTER — Ambulatory Visit (HOSPITAL_COMMUNITY)
Admission: RE | Admit: 2014-01-14 | Discharge: 2014-01-14 | Disposition: A | Discharge status: Home / Self Care | Payer: Medicare Other | Source: Ambulatory Visit | Attending: Anesthesiology | Admitting: Anesthesiology

## 2014-01-14 DIAGNOSIS — I700 Atherosclerosis of aorta: Secondary | ICD-10-CM | POA: Insufficient documentation

## 2014-01-14 DIAGNOSIS — Z01818 Encounter for other preprocedural examination: Secondary | ICD-10-CM | POA: Insufficient documentation

## 2014-01-14 DIAGNOSIS — Z01810 Encounter for preprocedural cardiovascular examination: Secondary | ICD-10-CM | POA: Insufficient documentation

## 2014-01-14 DIAGNOSIS — Z01812 Encounter for preprocedural laboratory examination: Secondary | ICD-10-CM | POA: Insufficient documentation

## 2014-01-14 HISTORY — DX: Unspecified glaucoma: H40.9

## 2014-01-14 HISTORY — DX: Dysphagia, unspecified: R13.10

## 2014-01-14 HISTORY — DX: Personal history of other diseases of the musculoskeletal system and connective tissue: Z87.39

## 2014-01-14 HISTORY — DX: Dizziness and giddiness: R42

## 2014-01-14 HISTORY — DX: Diplopia: H53.2

## 2014-01-14 HISTORY — DX: Gastro-esophageal reflux disease without esophagitis: K21.9

## 2014-01-14 HISTORY — DX: Essential (primary) hypertension: I10

## 2014-01-14 LAB — PROTIME-INR
INR: 0.92 (ref 0.00–1.49)
PROTHROMBIN TIME: 12.2 s (ref 11.6–15.2)

## 2014-01-14 LAB — URINALYSIS, ROUTINE W REFLEX MICROSCOPIC
BILIRUBIN URINE: NEGATIVE
Glucose, UA: NEGATIVE mg/dL
Hgb urine dipstick: NEGATIVE
KETONES UR: NEGATIVE mg/dL
Leukocytes, UA: NEGATIVE
Nitrite: NEGATIVE
PH: 5 (ref 5.0–8.0)
Protein, ur: NEGATIVE mg/dL
Specific Gravity, Urine: 1.03 (ref 1.005–1.030)
Urobilinogen, UA: 0.2 mg/dL (ref 0.0–1.0)

## 2014-01-14 LAB — BASIC METABOLIC PANEL
BUN: 18 mg/dL (ref 6–23)
CALCIUM: 10.4 mg/dL (ref 8.4–10.5)
CHLORIDE: 98 meq/L (ref 96–112)
CO2: 29 meq/L (ref 19–32)
CREATININE: 0.79 mg/dL (ref 0.50–1.10)
GFR calc non Af Amer: 73 mL/min — ABNORMAL LOW (ref >90)
GFR, EST AFRICAN AMERICAN: 84 mL/min — AB (ref >90)
Glucose, Bld: 104 mg/dL — ABNORMAL HIGH (ref 70–99)
POTASSIUM: 4.1 meq/L (ref 3.7–5.3)
SODIUM: 139 meq/L (ref 137–147)

## 2014-01-14 LAB — SURGICAL PCR SCREEN
MRSA, PCR: NEGATIVE
Staphylococcus aureus: NEGATIVE

## 2014-01-14 LAB — CBC
HEMATOCRIT: 40.7 % (ref 36.0–46.0)
Hemoglobin: 13.4 g/dL (ref 12.0–15.0)
MCH: 31.1 pg (ref 26.0–34.0)
MCHC: 32.9 g/dL (ref 30.0–36.0)
MCV: 94.4 fL (ref 78.0–100.0)
Platelets: 248 10*3/uL (ref 150–400)
RBC: 4.31 MIL/uL (ref 3.87–5.11)
RDW: 13.4 % (ref 11.5–15.5)
WBC: 8.1 10*3/uL (ref 4.0–10.5)

## 2014-01-14 LAB — APTT: aPTT: 29 seconds (ref 24–37)

## 2014-01-14 NOTE — Patient Instructions (Signed)
22 [name]  01/14/2014   Your procedure is scheduled on: Tuesday 01/21/14  Report to [facility] at 09:55 AM.  Call this number if you have problems the morning of surgery [phone]   Remember:   Do not eat food or drink liquids After Midnight.     Take these medicines the morning of surgery with A SIP OF WATER: eye drops, gabapentin, synthroid, omeprazole    Do not wear jewelry, make-up or nail polish.  Do not wear lotions, powders, or perfumes. You may wear deodorant.  Do not shave 48 hours prior to surgery. Men may shave face and neck.  Do not bring valuables to the hospital.  Contacts, dentures or bridgework may not be worn into surgery.  Leave suitcase in the car. After surgery it may be brought to your room.  For patients admitted to the hospital, checkout time is 11:00 AM the day of discharge.   Please read over the following fact sheets that you were given: MRSA Information Paulette Blanch, RN  pre op nurse call if needed [phone]    [facility] - Preparing for Surgery Before surgery, you can play an important role.  Because skin is not sterile, your skin needs to be as free of germs as possible.  You can reduce the number of germs on your skin by washing with CHG (chlorahexidine gluconate) soap before surgery.  CHG is an antiseptic cleaner which kills germs and bonds with the skin to continue killing germs even after washing. Please DO NOT use if you have an allergy to CHG or antibacterial soaps.  If your skin becomes reddened/irritated stop using the CHG and inform your nurse when you arrive at Short Stay. Do not shave (including legs and underarms) for at least 48 hours prior to the first CHG shower.  You may shave your face/neck. Please follow these instructions carefully:  1.  Shower with CHG Soap the night before surgery and the  morning of Surgery.  2.  If you choose to wash your hair, wash your hair first as usual with your  normal   shampoo.  3.  After you shampoo, rinse your hair and body thoroughly to remove the  shampoo.                            4.  Use CHG as you would any other liquid soap.  You can apply chg directly  to the skin and wash                       Gently with a scrungie or clean washcloth.  5.  Apply the CHG Soap to your body ONLY FROM THE NECK DOWN.   Do not use on face/ open                           Wound or open sores. Avoid contact with eyes, ears mouth and genitals (private parts).                       Wash face,  Genitals (private parts) with your normal soap.             6.  Wash thoroughly, paying special attention to the area where your surgery  will be performed.  7.  Thoroughly rinse your body with warm water from the neck  down.  8.  DO NOT shower/wash with your normal soap after using and rinsing off  the CHG Soap.                9.  Pat yourself dry with a clean towel.            10.  Wear clean pajamas.            11.  Place clean sheets on your bed the night of your first shower and do not  sleep with pets. Day of Surgery : Do not apply any lotions/deodorants the morning of surgery.  Please wear clean clothes to the hospital/surgery center.  FAILURE TO FOLLOW THESE INSTRUCTIONS MAY RESULT IN THE CANCELLATION OF YOUR SURGERY PATIENT SIGNATURE_________________________________  NURSE SIGNATURE__________________________________  ________________________________________________________________________  WHAT IS A BLOOD TRANSFUSION? Blood Transfusion Information  A transfusion is the replacement of blood or some of its parts. Blood is made up of multiple cells which provide different functions.  Red blood cells carry oxygen and are used for blood loss replacement.  White blood cells fight against infection.  Platelets control bleeding.  Plasma helps clot blood.  Other blood products are available for specialized needs, such as hemophilia or other clotting disorders. BEFORE THE  TRANSFUSION  Who gives blood for transfusions?   Healthy volunteers who are fully evaluated to make sure their blood is safe. This is blood bank blood. Transfusion therapy is the safest it has ever been in the practice of medicine. Before blood is taken from a donor, a complete history is taken to make sure that person has no history of diseases nor engages in risky social behavior (examples are intravenous drug use or sexual activity with multiple partners). The donor's travel history is screened to minimize risk of transmitting infections, such as malaria. The donated blood is tested for signs of infectious diseases, such as HIV and hepatitis. The blood is then tested to be sure it is compatible with you in order to minimize the chance of a transfusion reaction. If you or a relative donates blood, this is often done in anticipation of surgery and is not appropriate for emergency situations. It takes many days to process the donated blood. RISKS AND COMPLICATIONS Although transfusion therapy is very safe and saves many lives, the main dangers of transfusion include:   Getting an infectious disease.  Developing a transfusion reaction. This is an allergic reaction to something in the blood you were given. Every precaution is taken to prevent this. The decision to have a blood transfusion has been considered carefully by your caregiver before blood is given. Blood is not given unless the benefits outweigh the risks. AFTER THE TRANSFUSION  Right after receiving a blood transfusion, you will usually feel much better and more energetic. This is especially true if your red blood cells have gotten low (anemic). The transfusion raises the level of the red blood cells which carry oxygen, and this usually causes an energy increase.  The nurse administering the transfusion will monitor you carefully for complications. HOME CARE INSTRUCTIONS  No special instructions are needed after a transfusion. You may find  your energy is better. Speak with your caregiver about any limitations on activity for underlying diseases you may have. SEEK MEDICAL CARE IF:   Your condition is not improving after your transfusion.  You develop redness or irritation at the intravenous (IV) site. SEEK IMMEDIATE MEDICAL CARE IF:  Any of the following symptoms occur over the next 12 hours:  Shaking chills.  You have a temperature by mouth above 102 F (38.9 C), not controlled by medicine.  Chest, back, or muscle pain.  People around you feel you are not acting correctly or are confused.  Shortness of breath or difficulty breathing.  Dizziness and fainting.  You get a rash or develop hives.  You have a decrease in urine output.  Your urine turns a dark color or changes to pink, red, or brown. Any of the following symptoms occur over the next 10 days:  You have a temperature by mouth above 102 F (38.9 C), not controlled by medicine.  Shortness of breath.  Weakness after normal activity.  The white part of the eye turns yellow (jaundice).  You have a decrease in the amount of urine or are urinating less often.  Your urine turns a dark color or changes to pink, red, or brown. Document Released: 07/29/2000 Document Revised: 10/24/2011 Document Reviewed: 03/17/2008 Exit[employer] Patient Information 2014 [place].  _______________________________________________________________________  Incentive Spirometer  An incentive spirometer is a tool that can help keep your lungs clear and active. This tool measures how well you are filling your lungs with each breath. Taking long deep breaths may help reverse or decrease the chance of developing breathing (pulmonary) problems (especially infection) following:  A long period of time when you are unable to move or be active. BEFORE THE PROCEDURE   If the spirometer includes an indicator to show your best effort, your nurse or respiratory therapist will set it  to a desired goal.  If possible, sit up straight or lean slightly forward. Try not to slouch.  Hold the incentive spirometer in an upright position. INSTRUCTIONS FOR USE  1. Sit on the edge of your bed if possible, or sit up as far as you can in bed or on a chair. 2. Hold the incentive spirometer in an upright position. 3. Breathe out normally. 4. Place the mouthpiece in your mouth and seal your lips tightly around it. 5. Breathe in slowly and as deeply as possible, raising the piston or the ball toward the top of the column. 6. Hold your breath for 3-5 seconds or for as long as possible. Allow the piston or ball to fall to the bottom of the column. 7. Remove the mouthpiece from your mouth and breathe out normally. 8. Rest for a few seconds and repeat Steps 1 through 7 at least 10 times every 1-2 hours when you are awake. Take your time and take a few normal breaths between deep breaths. 9. The spirometer may include an indicator to show your best effort. Use the indicator as a goal to work toward during each repetition. 10. After each set of 10 deep breaths, practice coughing to be sure your lungs are clear. If you have an incision (the cut made at the time of surgery), support your incision when coughing by placing a pillow or rolled up towels firmly against it. Once you are able to get out of bed, walk around indoors and cough well. You may stop using the incentive spirometer when instructed by your caregiver.  RISKS AND COMPLICATIONS  Take your time so you do not get dizzy or light-headed.  If you are in pain, you may need to take or ask for pain medication before doing incentive spirometry. It is harder to take a deep breath if you are having pain. AFTER USE  Rest and breathe slowly and easily.  It can be helpful to keep track of  a log of your progress. Your caregiver can provide you with a simple table to help with this. If you are using the spirometer at home, follow these  instructions: Herman IF:   You are having difficultly using the spirometer.  You have trouble using the spirometer as often as instructed.  Your pain medication is not giving enough relief while using the spirometer.  You develop fever of 100.5 F (38.1 C) or higher. SEEK IMMEDIATE MEDICAL CARE IF:   You cough up bloody sputum that had not been present before.  You develop fever of 102 F (38.9 C) or greater.  You develop worsening pain at or near the incision site. MAKE SURE YOU:   Understand these instructions.  Will watch your condition.  Will get help right away if you are not doing well or get worse. Document Released: 12/12/2006 Document Revised: 10/24/2011 Document Reviewed: 02/12/2007 [facility] Patient Information 2014 [place], [place].   ________________________________________________________________________

## 2014-01-14 NOTE — Progress Notes (Signed)
Chest x-ray 2010 on chart, EKG 2013 on chart- will repeat both

## 2014-01-15 NOTE — Progress Notes (Signed)
01-15-14 Dr. Winfred Leeds noted abnormal EKG,, read by Dr. Andrena Mews.

## 2014-01-15 NOTE — Pre-Procedure Instructions (Addendum)
01-15-14 1630 Dr. Winfred Leeds noted abnormal EKG-read by Dr. Vallery Ridge. Note sent to Dr. Aurea Graff office.

## 2014-01-17 NOTE — H&P (Signed)
TOTAL KNEE ADMISSION H&P  Patient is being admitted for right total knee arthroplasty.  Subjective:  Chief Complaint:    Right knee OA / pain.  HPI: [name], 80 y.o. female, has a history of pain and functional disability in the right knee due to arthritis and has failed non-surgical conservative treatments for greater than 12 weeks to include NSAID's and/or analgesics, corticosteriod injections, viscosupplementation injections, use of assistive devices and activity modification.  Onset of symptoms was gradual, starting 2+ years ago with gradually worsening course since that time. The patient noted no past surgery on the right knee(s).  Patient currently rates pain in the right knee(s) at 10 out of 10 with activity. Patient has night pain, worsening of pain with activity and weight bearing, pain that interferes with activities of daily living and pain with passive range of motion.  Patient has evidence of periarticular osteophytes and joint space narrowing by imaging studies. There is no active infection.  Risks, benefits and expectations were discussed with the patient.  Risks including but not limited to the risk of anesthesia, blood clots, nerve damage, blood vessel damage, failure of the prosthesis, infection and up to and including death.  Patient understand the risks, benefits and expectations and wishes to proceed with surgery.   PCP: [name],[name], MD  D/C Plans:      SNF  Post-op Meds:        No Rx given  Tranexamic Acid:      Is to be given  Decadron:      Is to be given  FYI:     ASA post-op  Norco post-op   Patient Active Problem List   Diagnosis Date Noted  . Polyneuropathy in other diseases classified elsewhere 01/02/2014   Past Medical History  Diagnosis Date  . Spinal stenosis   . Spondylosis of lumbosacral region   . Scoliosis   . Shoulder pain   . Osteoarthritis     both knees  . Knee pain     bilateral  . Heart murmur   . Hypothyroidism   . Chronic  pain   . Polyneuropathy in other diseases classified elsewhere 01/02/2014  . Difficulty swallowing     in the morning  . Glaucoma   . Hypertension     "not high- on medication for precaution"  . GERD (gastroesophageal reflux disease)     occasional  . H/O rotator cuff tear 10 years ago    right  . Equilibrium disorder   . Double vision     right eye    Past Surgical History  Procedure Laterality Date  . Oophorectomy    . Cataract extraction      bilateral  . Dilation and curettage of uterus    . Esophageal dilation  ~2012  . Inguinal hernia repair Bilateral   . Shoulder surgery Left     "rebuilt"  . Total abdominal hysterectomy  1991    with bladder tac    No prescriptions prior to admission   No Known Allergies  History  Substance Use Topics  . Smoking status: Never Smoker   . Smokeless tobacco: Never Used  . Alcohol Use: No    Family History  Problem Relation Age of Onset  . CVA Mother   . CVA Father   . Cancer Brother      Review of Systems  Constitutional: Negative.   HENT: Negative.   Eyes: Negative.   Respiratory: Negative.   Cardiovascular: Negative.   Gastrointestinal:  Positive for heartburn and constipation.  Genitourinary: Negative.   Musculoskeletal: Positive for back pain and joint pain.  Skin: Negative.   Neurological: Negative.   Endo/Heme/Allergies: Negative.   Psychiatric/Behavioral: Negative.     Objective:  Physical Exam  Constitutional: She is oriented to person, place, and time. She appears well-developed and well-nourished.  HENT:  Head: Normocephalic and atraumatic.  Mouth/Throat: Oropharynx is clear and moist. She has dentures (partial).  Eyes: Pupils are equal, round, and reactive to light.  Neck: Neck supple. No JVD present. No tracheal deviation present. No thyromegaly present.  Cardiovascular: Normal rate, regular rhythm and intact distal pulses.   Murmur heard. Respiratory: Effort normal and breath sounds normal. No  stridor. No respiratory distress. She has no wheezes.  GI: Soft. There is no tenderness. There is no guarding.  Musculoskeletal:       Right knee: She exhibits decreased range of motion, swelling and bony tenderness. She exhibits no ecchymosis, no deformity, no laceration and no erythema. Tenderness found.  Lymphadenopathy:    She has no cervical adenopathy.  Neurological: She is alert and oriented to person, place, and time.  Skin: Skin is warm and dry.  Psychiatric: She has a normal mood and affect.    Labs:  Estimated body mass index is 26.14 kg/(m^2) as calculated from the following:   Height as of 01/02/14: 5\' 3"  (1.6 m).   Weight as of 01/02/14: 66.906 kg (147 lb 8 oz).   Imaging Review Plain radiographs demonstrate severe degenerative joint disease of the right knee(s). The overall alignment is neutral. The bone quality appears to be good for age and reported activity level.  Assessment/Plan:  End stage arthritis, right knee   The patient history, physical examination, clinical judgment of the provider and imaging studies are consistent with end stage degenerative joint disease of the right knee(s) and total knee arthroplasty is deemed medically necessary. The treatment options including medical management, injection therapy arthroscopy and arthroplasty were discussed at length. The risks and benefits of total knee arthroplasty were presented and reviewed. The risks due to aseptic loosening, infection, stiffness, patella tracking problems, thromboembolic complications and other imponderables were discussed. The patient acknowledged the explanation, agreed to proceed with the plan and consent was signed. Patient is being admitted for inpatient treatment for surgery, pain control, PT, OT, prophylactic antibiotics, VTE prophylaxis, progressive ambulation and ADL's and discharge planning. The patient is planning to be discharged to skilled nursing facility.    Ashley Pugh Hilmar Moldovan    PA-C  01/17/2014, 1:27 PM

## 2014-01-21 ENCOUNTER — Encounter (HOSPITAL_COMMUNITY): Payer: Self-pay | Admitting: *Deleted

## 2014-01-21 ENCOUNTER — Encounter (HOSPITAL_COMMUNITY)
Admission: RE | Disposition: A | Discharge status: Skilled Nursing Facility | Payer: Self-pay | Source: Ambulatory Visit | Attending: Orthopedic Surgery

## 2014-01-21 ENCOUNTER — Inpatient Hospital Stay (HOSPITAL_COMMUNITY): Payer: Medicare Other | Admitting: Anesthesiology

## 2014-01-21 ENCOUNTER — Encounter (HOSPITAL_COMMUNITY): Payer: Medicare Other | Admitting: Anesthesiology

## 2014-01-21 ENCOUNTER — Inpatient Hospital Stay (HOSPITAL_COMMUNITY)
Admission: RE | Admit: 2014-01-21 | Discharge: 2014-01-24 | DRG: 470 | Disposition: A | Discharge status: Skilled Nursing Facility | Payer: Medicare Other | Source: Ambulatory Visit | Attending: Orthopedic Surgery | Admitting: Orthopedic Surgery

## 2014-01-21 DIAGNOSIS — G8929 Other chronic pain: Secondary | ICD-10-CM | POA: Diagnosis present

## 2014-01-21 DIAGNOSIS — H409 Unspecified glaucoma: Secondary | ICD-10-CM | POA: Diagnosis present

## 2014-01-21 DIAGNOSIS — M1710 Unilateral primary osteoarthritis, unspecified knee: Principal | ICD-10-CM | POA: Diagnosis present

## 2014-01-21 DIAGNOSIS — E039 Hypothyroidism, unspecified: Secondary | ICD-10-CM | POA: Diagnosis present

## 2014-01-21 DIAGNOSIS — E663 Overweight: Secondary | ICD-10-CM | POA: Diagnosis present

## 2014-01-21 DIAGNOSIS — I10 Essential (primary) hypertension: Secondary | ICD-10-CM | POA: Diagnosis present

## 2014-01-21 DIAGNOSIS — Z9849 Cataract extraction status, unspecified eye: Secondary | ICD-10-CM

## 2014-01-21 DIAGNOSIS — D62 Acute posthemorrhagic anemia: Secondary | ICD-10-CM | POA: Diagnosis not present

## 2014-01-21 DIAGNOSIS — Z6825 Body mass index (BMI) 25.0-25.9, adult: Secondary | ICD-10-CM

## 2014-01-21 DIAGNOSIS — Z96659 Presence of unspecified artificial knee joint: Secondary | ICD-10-CM

## 2014-01-21 DIAGNOSIS — D500 Iron deficiency anemia secondary to blood loss (chronic): Secondary | ICD-10-CM | POA: Diagnosis not present

## 2014-01-21 DIAGNOSIS — K219 Gastro-esophageal reflux disease without esophagitis: Secondary | ICD-10-CM | POA: Diagnosis present

## 2014-01-21 HISTORY — PX: TOTAL KNEE ARTHROPLASTY: SHX125

## 2014-01-21 LAB — TYPE AND SCREEN
ABO/RH(D): A POS
Antibody Screen: NEGATIVE

## 2014-01-21 LAB — ABO/RH: ABO/RH(D): A POS

## 2014-01-21 SURGERY — ARTHROPLASTY, KNEE, TOTAL
Anesthesia: Spinal | Site: Knee | Laterality: Right

## 2014-01-21 MED ORDER — LACTATED RINGERS IV SOLN
INTRAVENOUS | Status: DC
  Administered 2014-01-21: 1000 mL via INTRAVENOUS

## 2014-01-21 MED ORDER — PHENYLEPHRINE HCL 10 MG/ML IJ SOLN
INTRAMUSCULAR | Status: AC
  Filled 2014-01-21: qty 2

## 2014-01-21 MED ORDER — SODIUM CHLORIDE 0.9 % IV SOLN
INTRAVENOUS | Status: DC
  Administered 2014-01-21: 23:00:00 via INTRAVENOUS
  Filled 2014-01-21 (×8): qty 1000

## 2014-01-21 MED ORDER — HYDROMORPHONE HCL PF 1 MG/ML IJ SOLN
0.5000 mg | INTRAMUSCULAR | Status: DC | PRN
  Administered 2014-01-21: 1 mg via INTRAVENOUS
  Administered 2014-01-21 (×2): 0.5 mg via INTRAVENOUS
  Filled 2014-01-21 (×3): qty 1

## 2014-01-21 MED ORDER — LIDOCAINE HCL (CARDIAC) 20 MG/ML IV SOLN
INTRAVENOUS | Status: DC | PRN
  Administered 2014-01-21: 100 mg via INTRAVENOUS

## 2014-01-21 MED ORDER — TRANDOLAPRIL 1 MG PO TABS
1.0000 mg | ORAL_TABLET | Freq: Every day | ORAL | Status: DC
  Administered 2014-01-22: 1 mg via ORAL
  Filled 2014-01-21 (×4): qty 1

## 2014-01-21 MED ORDER — PROPOFOL 10 MG/ML IV BOLUS
INTRAVENOUS | Status: AC
  Filled 2014-01-21: qty 20

## 2014-01-21 MED ORDER — CELECOXIB 200 MG PO CAPS
200.0000 mg | ORAL_CAPSULE | Freq: Two times a day (BID) | ORAL | Status: DC
  Administered 2014-01-21 – 2014-01-24 (×6): 200 mg via ORAL
  Filled 2014-01-21 (×7): qty 1

## 2014-01-21 MED ORDER — ASPIRIN EC 325 MG PO TBEC
325.0000 mg | DELAYED_RELEASE_TABLET | Freq: Two times a day (BID) | ORAL | Status: DC
  Administered 2014-01-22 – 2014-01-24 (×5): 325 mg via ORAL
  Filled 2014-01-21 (×7): qty 1

## 2014-01-21 MED ORDER — BUPIVACAINE-EPINEPHRINE (PF) 0.25% -1:200000 IJ SOLN
INTRAMUSCULAR | Status: AC
  Filled 2014-01-21: qty 30

## 2014-01-21 MED ORDER — METOCLOPRAMIDE HCL 5 MG/ML IJ SOLN: 5.0000 mg | Freq: Three times a day (TID) | INTRAMUSCULAR | Status: DC | PRN

## 2014-01-21 MED ORDER — CEFAZOLIN SODIUM-DEXTROSE 2-3 GM-% IV SOLR
2.0000 g | Freq: Four times a day (QID) | INTRAVENOUS | Status: AC
  Administered 2014-01-21 – 2014-01-22 (×2): 2 g via INTRAVENOUS
  Filled 2014-01-21 (×2): qty 50

## 2014-01-21 MED ORDER — MIDAZOLAM HCL 2 MG/2ML IJ SOLN
INTRAMUSCULAR | Status: AC
  Filled 2014-01-21: qty 2

## 2014-01-21 MED ORDER — GABAPENTIN 100 MG PO CAPS
100.0000 mg | ORAL_CAPSULE | Freq: Three times a day (TID) | ORAL | Status: DC
  Administered 2014-01-21 – 2014-01-24 (×9): 100 mg via ORAL
  Filled 2014-01-21 (×11): qty 1

## 2014-01-21 MED ORDER — BISACODYL 10 MG RE SUPP: 10.0000 mg | Freq: Every day | RECTAL | Status: DC | PRN

## 2014-01-21 MED ORDER — PROMETHAZINE HCL 25 MG/ML IJ SOLN
6.2500 mg | INTRAMUSCULAR | Status: DC | PRN
Start: 2014-01-21 — End: 2014-01-21

## 2014-01-21 MED ORDER — ONDANSETRON HCL 4 MG/2ML IJ SOLN
INTRAMUSCULAR | Status: DC | PRN
  Administered 2014-01-21: 4 mg via INTRAVENOUS

## 2014-01-21 MED ORDER — CEFAZOLIN SODIUM-DEXTROSE 2-3 GM-% IV SOLR
2.0000 g | INTRAVENOUS | Status: AC
  Administered 2014-01-21: 2 g via INTRAVENOUS

## 2014-01-21 MED ORDER — DEXAMETHASONE SODIUM PHOSPHATE 10 MG/ML IJ SOLN
10.0000 mg | Freq: Once | INTRAMUSCULAR | Status: AC
  Administered 2014-01-22: 10 mg via INTRAVENOUS
  Filled 2014-01-21 (×2): qty 1

## 2014-01-21 MED ORDER — SIMVASTATIN 20 MG PO TABS
20.0000 mg | ORAL_TABLET | Freq: Every day | ORAL | Status: DC
  Administered 2014-01-21 – 2014-01-23 (×2): 20 mg via ORAL
  Filled 2014-01-21 (×4): qty 1

## 2014-01-21 MED ORDER — POLYETHYLENE GLYCOL 3350 17 G PO PACK
17.0000 g | PACK | Freq: Two times a day (BID) | ORAL | Status: DC
  Administered 2014-01-21 – 2014-01-23 (×4): 17 g via ORAL

## 2014-01-21 MED ORDER — ONDANSETRON HCL 4 MG/2ML IJ SOLN: 4.0000 mg | Freq: Four times a day (QID) | INTRAMUSCULAR | Status: DC | PRN

## 2014-01-21 MED ORDER — BUPIVACAINE-EPINEPHRINE (PF) 0.25% -1:200000 IJ SOLN
INTRAMUSCULAR | Status: DC | PRN
  Administered 2014-01-21: 30 mL

## 2014-01-21 MED ORDER — SODIUM CHLORIDE 0.9 % IJ SOLN
INTRAMUSCULAR | Status: AC
  Filled 2014-01-21: qty 20

## 2014-01-21 MED ORDER — HYDROCHLOROTHIAZIDE 25 MG PO TABS
12.5000 mg | ORAL_TABLET | Freq: Every morning | ORAL | Status: DC
Start: 2014-01-22 — End: 2014-01-24
  Administered 2014-01-22: 12.5 mg via ORAL
  Filled 2014-01-21 (×3): qty 0.5

## 2014-01-21 MED ORDER — MENTHOL 3 MG MT LOZG
1.0000 | LOZENGE | OROMUCOSAL | Status: DC | PRN
  Filled 2014-01-21: qty 9

## 2014-01-21 MED ORDER — FENTANYL CITRATE 0.05 MG/ML IJ SOLN
INTRAMUSCULAR | Status: DC | PRN
  Administered 2014-01-21 (×2): 25 ug via INTRAVENOUS

## 2014-01-21 MED ORDER — METHOCARBAMOL 500 MG PO TABS
500.0000 mg | ORAL_TABLET | Freq: Four times a day (QID) | ORAL | Status: DC | PRN
  Administered 2014-01-23: 500 mg via ORAL
  Filled 2014-01-21: qty 1

## 2014-01-21 MED ORDER — CEFAZOLIN SODIUM-DEXTROSE 2-3 GM-% IV SOLR
INTRAVENOUS | Status: AC
  Filled 2014-01-21: qty 50

## 2014-01-21 MED ORDER — KETOROLAC TROMETHAMINE 30 MG/ML IJ SOLN
INTRAMUSCULAR | Status: DC | PRN
  Administered 2014-01-21: 30 mg

## 2014-01-21 MED ORDER — HYDROMORPHONE HCL PF 1 MG/ML IJ SOLN: 0.2500 mg | INTRAMUSCULAR | Status: DC | PRN

## 2014-01-21 MED ORDER — PANTOPRAZOLE SODIUM 40 MG PO TBEC
40.0000 mg | DELAYED_RELEASE_TABLET | Freq: Every day | ORAL | Status: DC
  Administered 2014-01-22: 40 mg via ORAL
  Filled 2014-01-21 (×4): qty 1

## 2014-01-21 MED ORDER — MIDAZOLAM HCL 5 MG/5ML IJ SOLN
INTRAMUSCULAR | Status: DC | PRN
  Administered 2014-01-21: 0.5 mg via INTRAVENOUS

## 2014-01-21 MED ORDER — METHOCARBAMOL 1000 MG/10ML IJ SOLN
500.0000 mg | Freq: Four times a day (QID) | INTRAVENOUS | Status: DC | PRN
  Administered 2014-01-21: 500 mg via INTRAVENOUS
  Filled 2014-01-21: qty 5

## 2014-01-21 MED ORDER — PHENYLEPHRINE HCL 10 MG/ML IJ SOLN
INTRAMUSCULAR | Status: DC | PRN
  Administered 2014-01-21: 80 ug via INTRAVENOUS

## 2014-01-21 MED ORDER — LEVOTHYROXINE SODIUM 75 MCG PO TABS
75.0000 ug | ORAL_TABLET | Freq: Every day | ORAL | Status: DC
  Administered 2014-01-22 – 2014-01-24 (×3): 75 ug via ORAL
  Filled 2014-01-21 (×4): qty 1

## 2014-01-21 MED ORDER — DOCUSATE SODIUM 100 MG PO CAPS
100.0000 mg | ORAL_CAPSULE | Freq: Two times a day (BID) | ORAL | Status: DC
  Administered 2014-01-21 – 2014-01-23 (×5): 100 mg via ORAL

## 2014-01-21 MED ORDER — DIPHENHYDRAMINE HCL 25 MG PO CAPS: 25.0000 mg | ORAL_CAPSULE | Freq: Four times a day (QID) | ORAL | Status: DC | PRN

## 2014-01-21 MED ORDER — DEXAMETHASONE SODIUM PHOSPHATE 10 MG/ML IJ SOLN
10.0000 mg | Freq: Once | INTRAMUSCULAR | Status: AC
  Administered 2014-01-21: 10 mg via INTRAVENOUS

## 2014-01-21 MED ORDER — MAGNESIUM CITRATE PO SOLN: 1.0000 | Freq: Once | ORAL | Status: AC | PRN

## 2014-01-21 MED ORDER — ALUM & MAG HYDROXIDE-SIMETH 200-200-20 MG/5ML PO SUSP: 30.0000 mL | ORAL | Status: DC | PRN

## 2014-01-21 MED ORDER — SODIUM CHLORIDE 0.9 % IJ SOLN
INTRAMUSCULAR | Status: DC | PRN
  Administered 2014-01-21: 9 mL

## 2014-01-21 MED ORDER — PHENYLEPHRINE 40 MCG/ML (10ML) SYRINGE FOR IV PUSH (FOR BLOOD PRESSURE SUPPORT)
PREFILLED_SYRINGE | INTRAVENOUS | Status: AC
  Filled 2014-01-21: qty 10

## 2014-01-21 MED ORDER — ONDANSETRON HCL 4 MG/2ML IJ SOLN
INTRAMUSCULAR | Status: AC
  Filled 2014-01-21: qty 2

## 2014-01-21 MED ORDER — KETOROLAC TROMETHAMINE 30 MG/ML IJ SOLN
INTRAMUSCULAR | Status: AC
  Filled 2014-01-21: qty 1

## 2014-01-21 MED ORDER — BRINZOLAMIDE 1 % OP SUSP
1.0000 [drp] | Freq: Two times a day (BID) | OPHTHALMIC | Status: DC
  Administered 2014-01-21 – 2014-01-24 (×6): 1 [drp] via OPHTHALMIC
  Filled 2014-01-21: qty 10

## 2014-01-21 MED ORDER — PHENOL 1.4 % MT LIQD
1.0000 | OROMUCOSAL | Status: DC | PRN
  Filled 2014-01-21: qty 177

## 2014-01-21 MED ORDER — ONDANSETRON HCL 4 MG PO TABS: 4.0000 mg | ORAL_TABLET | Freq: Four times a day (QID) | ORAL | Status: DC | PRN

## 2014-01-21 MED ORDER — METOCLOPRAMIDE HCL 10 MG PO TABS
5.0000 mg | ORAL_TABLET | Freq: Three times a day (TID) | ORAL | Status: DC | PRN
  Administered 2014-01-21: 10 mg via ORAL
  Filled 2014-01-21: qty 1

## 2014-01-21 MED ORDER — BUPIVACAINE IN DEXTROSE 0.75-8.25 % IT SOLN
INTRATHECAL | Status: DC | PRN
  Administered 2014-01-21: 1.6 mL via INTRATHECAL

## 2014-01-21 MED ORDER — FERROUS SULFATE 325 (65 FE) MG PO TABS
325.0000 mg | ORAL_TABLET | Freq: Three times a day (TID) | ORAL | Status: DC
  Administered 2014-01-22 – 2014-01-23 (×4): 325 mg via ORAL
  Filled 2014-01-21 (×11): qty 1

## 2014-01-21 MED ORDER — FENTANYL CITRATE 0.05 MG/ML IJ SOLN
INTRAMUSCULAR | Status: AC
  Filled 2014-01-21: qty 2

## 2014-01-21 MED ORDER — BUPIVACAINE LIPOSOME 1.3 % IJ SUSP
20.0000 mL | Freq: Once | INTRAMUSCULAR | Status: AC
  Administered 2014-01-21: 20 mL
  Filled 2014-01-21: qty 20

## 2014-01-21 MED ORDER — DEXTROSE 5 % IV SOLN
20.0000 mg | INTRAVENOUS | Status: DC | PRN
  Administered 2014-01-21: 10 ug/min via INTRAVENOUS

## 2014-01-21 MED ORDER — SODIUM CHLORIDE 0.9 % IR SOLN
Status: DC | PRN
  Administered 2014-01-21: 1000 mL

## 2014-01-21 MED ORDER — HYDROCODONE-ACETAMINOPHEN 7.5-325 MG PO TABS
1.0000 | ORAL_TABLET | ORAL | Status: DC
  Administered 2014-01-21 – 2014-01-22 (×4): 1 via ORAL
  Filled 2014-01-21 (×4): qty 1

## 2014-01-21 MED ORDER — TRANEXAMIC ACID 100 MG/ML IV SOLN
1000.0000 mg | Freq: Once | INTRAVENOUS | Status: AC
  Administered 2014-01-21: 1000 mg via INTRAVENOUS
  Filled 2014-01-21: qty 10

## 2014-01-21 MED ORDER — PROPOFOL INFUSION 10 MG/ML OPTIME
INTRAVENOUS | Status: DC | PRN
  Administered 2014-01-21: 50 ug/kg/min via INTRAVENOUS

## 2014-01-21 SURGICAL SUPPLY — 54 items
BAG ZIPLOCK 12X15 (MISCELLANEOUS) IMPLANT
BANDAGE ELASTIC 6 VELCRO ST LF (GAUZE/BANDAGES/DRESSINGS) ×3 IMPLANT
BANDAGE ESMARK 6X9 LF (GAUZE/BANDAGES/DRESSINGS) ×1 IMPLANT
BLADE SAW SGTL 13.0X1.19X90.0M (BLADE) ×3 IMPLANT
BNDG ESMARK 6X9 LF (GAUZE/BANDAGES/DRESSINGS) ×3
BOWL SMART MIX CTS (DISPOSABLE) ×3 IMPLANT
CAPT RP KNEE ×3 IMPLANT
CEMENT HV SMART SET (Cement) ×6 IMPLANT
CUFF TOURN SGL QUICK 34 (TOURNIQUET CUFF) ×2
CUFF TRNQT CYL 34X4X40X1 (TOURNIQUET CUFF) ×1 IMPLANT
DERMABOND ADVANCED (GAUZE/BANDAGES/DRESSINGS) ×2
DERMABOND ADVANCED .7 DNX12 (GAUZE/BANDAGES/DRESSINGS) ×1 IMPLANT
DRAPE EXTREMITY T 121X128X90 (DRAPE) ×3 IMPLANT
DRAPE POUCH INSTRU U-SHP 10X18 (DRAPES) ×3 IMPLANT
DRAPE U-SHAPE 47X51 STRL (DRAPES) ×3 IMPLANT
DRSG AQUACEL AG ADV 3.5X10 (GAUZE/BANDAGES/DRESSINGS) ×3 IMPLANT
DRSG TEGADERM 4X4.75 (GAUZE/BANDAGES/DRESSINGS) IMPLANT
DURAPREP 26ML APPLICATOR (WOUND CARE) ×6 IMPLANT
ELECT REM PT RETURN 9FT ADLT (ELECTROSURGICAL) ×3
ELECTRODE REM PT RTRN 9FT ADLT (ELECTROSURGICAL) ×1 IMPLANT
EVACUATOR 1/8 PVC DRAIN (DRAIN) IMPLANT
FACESHIELD WRAPAROUND (MASK) ×15 IMPLANT
GAUZE SPONGE 2X2 8PLY STRL LF (GAUZE/BANDAGES/DRESSINGS) IMPLANT
GLOVE BIOGEL PI IND STRL 7.5 (GLOVE) ×1 IMPLANT
GLOVE BIOGEL PI IND STRL 8 (GLOVE) ×1 IMPLANT
GLOVE BIOGEL PI INDICATOR 7.5 (GLOVE) ×2
GLOVE BIOGEL PI INDICATOR 8 (GLOVE) ×2
GLOVE ECLIPSE 8.0 STRL XLNG CF (GLOVE) ×3 IMPLANT
GLOVE ORTHO TXT STRL SZ7.5 (GLOVE) ×6 IMPLANT
GOWN SPEC L3 XXLG W/TWL (GOWN DISPOSABLE) ×3 IMPLANT
GOWN STRL REUS W/TWL LRG LVL3 (GOWN DISPOSABLE) ×3 IMPLANT
HANDPIECE INTERPULSE COAX TIP (DISPOSABLE) ×2
KIT BASIN OR (CUSTOM PROCEDURE TRAY) ×3 IMPLANT
MANIFOLD NEPTUNE II (INSTRUMENTS) ×3 IMPLANT
NDL SAFETY ECLIPSE 18X1.5 (NEEDLE) ×1 IMPLANT
NEEDLE HYPO 18GX1.5 SHARP (NEEDLE) ×2
PACK TOTAL JOINT (CUSTOM PROCEDURE TRAY) ×3 IMPLANT
PENCIL BUTTON HOLSTER BLD 10FT (ELECTRODE) ×3 IMPLANT
POSITIONER SURGICAL ARM (MISCELLANEOUS) ×3 IMPLANT
SET HNDPC FAN SPRY TIP SCT (DISPOSABLE) ×1 IMPLANT
SET PAD KNEE POSITIONER (MISCELLANEOUS) ×3 IMPLANT
SPONGE GAUZE 2X2 STER 10/PKG (GAUZE/BANDAGES/DRESSINGS)
SUCTION FRAZIER 12FR DISP (SUCTIONS) ×3 IMPLANT
SUT MNCRL AB 4-0 PS2 18 (SUTURE) ×3 IMPLANT
SUT VIC AB 1 CT1 36 (SUTURE) ×3 IMPLANT
SUT VIC AB 2-0 CT1 27 (SUTURE) ×6
SUT VIC AB 2-0 CT1 TAPERPNT 27 (SUTURE) ×3 IMPLANT
SUT VLOC 180 0 24IN GS25 (SUTURE) ×3 IMPLANT
SYR 50ML LL SCALE MARK (SYRINGE) ×3 IMPLANT
TOWEL OR 17X26 10 PK STRL BLUE (TOWEL DISPOSABLE) ×3 IMPLANT
TOWEL OR NON WOVEN STRL DISP B (DISPOSABLE) IMPLANT
TRAY FOLEY CATH 14FRSI W/METER (CATHETERS) ×3 IMPLANT
WATER STERILE IRR 1500ML POUR (IV SOLUTION) ×3 IMPLANT
WRAP KNEE MAXI GEL POST OP (GAUZE/BANDAGES/DRESSINGS) ×3 IMPLANT

## 2014-01-21 NOTE — Transfer of Care (Signed)
Immediate Anesthesia Transfer of Care Note  Patient: [name]  Procedure(s) Performed: Procedure(s): RIGHT TOTAL KNEE ARTHROPLASTY (Right)  Patient Location: PACU  Anesthesia Type:MAC and Spinal  Level of Consciousness: awake, alert , oriented and patient cooperative  Airway & Oxygen Therapy: Patient Spontanous Breathing and Patient connected to face mask oxygen  Post-op Assessment: Report given to PACU RN and Post -op Vital signs reviewed and stable  Post vital signs: Reviewed and stable  Complications: No apparent anesthesia complications

## 2014-01-21 NOTE — Interval H&P Note (Signed)
History and Physical Interval Note:  01/21/2014 11:46 AM  [name]  has presented today for surgery, with the diagnosis of right knee osteoarthritis  The various methods of treatment have been discussed with the patient and family. After consideration of risks, benefits and other options for treatment, the patient has consented to  Procedure(s): RIGHT TOTAL KNEE ARTHROPLASTY (Right) as a surgical intervention .  The patient's history has been reviewed, patient examined, no change in status, stable for surgery.  I have reviewed the patient's chart and labs.  Questions were answered to the patient's satisfaction.     Mauri Pole

## 2014-01-21 NOTE — Plan of Care (Signed)
Problem: Consults Goal: Diagnosis- Total Joint Replacement Primary Total Knee     

## 2014-01-21 NOTE — Op Note (Signed)
NAME:  [name]                      MEDICAL RECORD NO.:  [id number]                             FACILITY:  [facility]      PHYSICIAN:  Pietro Cassis. Alvan Dame, M.D.  DATE OF BIRTH:  10/21/1924      DATE OF PROCEDURE:  01/21/2014                                     OPERATIVE REPORT         PREOPERATIVE DIAGNOSIS:  Right knee osteoarthritis.      POSTOPERATIVE DIAGNOSIS:  Right knee osteoarthritis.      FINDINGS:  The patient was noted to have complete loss of cartilage and   bone-on-bone arthritis with associated osteophytes in the lateral and patellofemoral compartments of   the knee with a significant synovitis and associated effusion.      PROCEDURE:  Right total knee replacement.      COMPONENTS USED:  DePuy rotating platform posterior stabilized knee   system, a size 3 femur, 2.5 tibia, 10 mm PS insert, and 35 patellar   button.      SURGEON:  Pietro Cassis. Alvan Dame, M.D.      ASSISTANT:  Danae Orleans, PA-C.      ANESTHESIA:  Spinal.      SPECIMENS:  None.      COMPLICATION:  None.      DRAINS:  None.  EBL: <100cc      TOURNIQUET TIME:   Total Tourniquet Time Documented: Thigh (Right) - 28 minutes Total: Thigh (Right) - 28 minutes  .      The patient was stable to the recovery room.      INDICATION FOR PROCEDURE:  [name] is a 80 y.o. female patient of   mine.  The patient had been seen, evaluated, and treated conservatively in the   office with medication, activity modification, and injections.  The patient had   radiographic changes of bone-on-bone arthritis with endplate sclerosis and osteophytes noted.      The patient failed conservative measures including medication, injections, and activity modification, and at this point was ready for more definitive measures.   Based on the radiographic changes and failed conservative measures, the patient   decided to proceed with total knee replacement.  Risks of infection,   DVT, component failure, need for revision  surgery, postop course, and   expectations were all   discussed and reviewed.  Consent was obtained for benefit of pain   relief.      PROCEDURE IN DETAIL:  The patient was brought to the operative theater.   Once adequate anesthesia, preoperative antibiotics, 2 gm of Ancef administered, the patient was positioned supine with the right thigh tourniquet placed.  The  right lower extremity was prepped and draped in sterile fashion.  A time-   out was performed identifying the patient, planned procedure, and   extremity.      The right lower extremity was placed in the [facility] leg holder.  The leg was   exsanguinated, tourniquet elevated to 250 mmHg.  A midline incision was   made followed by median parapatellar arthrotomy.  Following initial   exposure, attention was first  directed to the patella.  Precut   measurement was noted to be 20 mm.  I resected down to 14 mm and used a   35 patellar button to restore patellar height as well as cover the cut   surface.      The lug holes were drilled and a metal shim was placed to protect the   patella from retractors and saw blades.      At this point, attention was now directed to the femur.  The femoral   canal was opened with a drill, irrigated to try to prevent fat emboli.  An   intramedullary rod was passed at 5 degrees valgus, 10 mm of bone was   resected off the distal femur.  Following this resection, the tibia was   subluxated anteriorly.  Using the extramedullary guide, 2 mm of bone was resected off   the proximal lateral tibia.  We confirmed the gap would be   stable medially and laterally with a 10 mm insert as well as confirmed   the cut was perpendicular in the coronal plane, checking with an alignment rod.      Once this was done, I sized the femur to be a size 3 in the anterior-   posterior dimension, chose a standard component based on medial and   lateral dimension.  The size 3 rotation block was then pinned in   position  anterior referenced using the C-clamp to set rotation.  The   anterior, posterior, and  chamfer cuts were made without difficulty nor   notching making certain that I was along the anterior cortex to help   with flexion gap stability.      The final box cut was made off the lateral aspect of distal femur.      At this point, the tibia was sized to be a size 2.5, the size 2.5 tray was   then pinned in position through the medial third of the tubercle,   drilled, and keel punched.  Trial reduction was now carried with a 3 femur,  2.5 tibia, a 10 mm insert, and the 35 patella botton.  The knee was brought to   extension, full extension with good flexion stability with the patella   tracking through the trochlea without application of pressure.  Given   all these findings, the trial components removed.  Final components were   opened and cement was mixed.  The knee was irrigated with normal saline   solution and pulse lavage.  The synovial lining was   then injected with 20cc of Exparel, 30cc of  0.25% Marcaine with epinephrine and 1 cc of Toradol,   total of 61 cc.      The knee was irrigated.  Final implants were then cemented onto clean and   dried cut surfaces of bone with the knee brought to extension with a 10 mm trial insert.      Once the cement had fully cured, the excess cement was removed   throughout the knee.  I confirmed I was satisfied with the range of   motion and stability, and the final 10 mm PS insert was chosen.  It was   placed into the knee.      The tourniquet had been let down at 28 minutes.  No significant   hemostasis required.  The   extensor mechanism was then reapproximated using #1 Vicryl with the knee   in flexion.  The   remaining wound  was closed with 2-0 Vicryl and running 4-0 Monocryl.   The knee was cleaned, dried, dressed sterilely using Dermabond and   Aquacel dressing.  The patient was then   brought to recovery room in stable condition,  tolerating the procedure   well.   Please note that Physician Assistant, Danae Orleans, PA-C, was present for the entirety of the case, and was utilized for pre-operative positioning, peri-operative retractor management, general facilitation of the procedure.  He was also utilized for primary wound closure at the end of the case.              Pietro Cassis Alvan Dame, M.D.    01/21/2014 2:27 PM

## 2014-01-21 NOTE — Anesthesia Preprocedure Evaluation (Addendum)
Anesthesia Evaluation  Patient identified by MRN, date of birth, ID band Patient awake    Reviewed: Allergy & Precautions, H&P , NPO status , Patient's Chart, lab work & pertinent test results  Airway Mallampati: II TM Distance: >3 FB Neck ROM: Limited    Dental no notable dental hx.    Pulmonary neg pulmonary ROS,  breath sounds clear to auscultation  Pulmonary exam normal       Cardiovascular hypertension, Rhythm:Regular Rate:Normal     Neuro/Psych negative neurological ROS  negative psych ROS   GI/Hepatic negative GI ROS, Neg liver ROS,   Endo/Other  Hypothyroidism   Renal/GU negative Renal ROS  negative genitourinary   Musculoskeletal negative musculoskeletal ROS (+)   Abdominal   Peds negative pediatric ROS (+)  Hematology negative hematology ROS (+)   Anesthesia Other Findings   Reproductive/Obstetrics negative OB ROS                          Anesthesia Physical Anesthesia Plan  ASA: II  Anesthesia Plan: Spinal   Post-op Pain Management:    Induction: Intravenous  Airway Management Planned: Simple Face Mask  Additional Equipment:   Intra-op Plan:   Post-operative Plan:   Informed Consent: I have reviewed the patients History and Physical, chart, labs and discussed the procedure including the risks, benefits and alternatives for the proposed anesthesia with the patient or authorized representative who has indicated his/her understanding and acceptance.   Dental advisory given  Plan Discussed with: CRNA and Surgeon  Anesthesia Plan Comments:         Anesthesia Quick Evaluation

## 2014-01-21 NOTE — Progress Notes (Signed)
O.K. To go to floor with spinal level of T12- per Dr. Kalman Shan.

## 2014-01-21 NOTE — Progress Notes (Signed)
Utilization review completed.  

## 2014-01-21 NOTE — Anesthesia Procedure Notes (Signed)
Spinal  Patient location during procedure: OR Staffing Performed by: anesthesiologist  Preanesthetic Checklist Completed: patient identified, site marked, surgical consent, pre-op evaluation, timeout performed, IV checked, risks and benefits discussed and monitors and equipment checked Spinal Block Patient position: sitting Prep: Betadine Patient monitoring: heart rate, continuous pulse ox and blood pressure Location: L4-5 Injection technique: single-shot Needle Needle type: Spinocan  Needle gauge: 22 G Needle length: 9 cm Additional Notes Expiration date of kit checked and confirmed. Patient tolerated procedure well, without complications.

## 2014-01-21 NOTE — Anesthesia Postprocedure Evaluation (Signed)
  Anesthesia Post-op Note  Patient: [name]  Procedure(s) Performed: Procedure(s) (LRB): RIGHT TOTAL KNEE ARTHROPLASTY (Right)  Patient Location: PACU  Anesthesia Type: Spinal  Level of Consciousness: awake and alert   Airway and Oxygen Therapy: Patient Spontanous Breathing  Post-op Pain: mild  Post-op Assessment: Post-op Vital signs reviewed, Patient's Cardiovascular Status Stable, Respiratory Function Stable, Patent Airway and No signs of Nausea or vomiting  Last Vitals:  Filed Vitals:   01/21/14 1624  BP: 128/72  Pulse: 72  Temp: 36.7 C  Resp: 14    Post-op Vital Signs: stable   Complications: No apparent anesthesia complications

## 2014-01-22 DIAGNOSIS — E663 Overweight: Secondary | ICD-10-CM | POA: Diagnosis present

## 2014-01-22 DIAGNOSIS — D500 Iron deficiency anemia secondary to blood loss (chronic): Secondary | ICD-10-CM | POA: Diagnosis not present

## 2014-01-22 LAB — BASIC METABOLIC PANEL
BUN: 15 mg/dL (ref 6–23)
CHLORIDE: 100 meq/L (ref 96–112)
CO2: 25 mEq/L (ref 19–32)
Calcium: 9.3 mg/dL (ref 8.4–10.5)
Creatinine, Ser: 0.75 mg/dL (ref 0.50–1.10)
GFR calc Af Amer: 86 mL/min — ABNORMAL LOW (ref >90)
GFR calc non Af Amer: 74 mL/min — ABNORMAL LOW (ref >90)
Glucose, Bld: 139 mg/dL — ABNORMAL HIGH (ref 70–99)
POTASSIUM: 4 meq/L (ref 3.7–5.3)
Sodium: 137 mEq/L (ref 137–147)

## 2014-01-22 LAB — CBC
HEMATOCRIT: 32.9 % — AB (ref 36.0–46.0)
Hemoglobin: 10.9 g/dL — ABNORMAL LOW (ref 12.0–15.0)
MCH: 31.1 pg (ref 26.0–34.0)
MCHC: 33.1 g/dL (ref 30.0–36.0)
MCV: 94 fL (ref 78.0–100.0)
PLATELETS: 204 10*3/uL (ref 150–400)
RBC: 3.5 MIL/uL — ABNORMAL LOW (ref 3.87–5.11)
RDW: 13.2 % (ref 11.5–15.5)
WBC: 11.9 10*3/uL — AB (ref 4.0–10.5)

## 2014-01-22 MED ORDER — HYDROCODONE-ACETAMINOPHEN 7.5-325 MG PO TABS
1.0000 | ORAL_TABLET | ORAL | Status: DC | PRN
  Administered 2014-01-22 (×4): 1 via ORAL
  Administered 2014-01-23 (×2): 2 via ORAL
  Administered 2014-01-23: 1 via ORAL
  Administered 2014-01-23 – 2014-01-24 (×2): 2 via ORAL
  Filled 2014-01-22: qty 1
  Filled 2014-01-22 (×2): qty 2
  Filled 2014-01-22: qty 1
  Filled 2014-01-22 (×2): qty 2
  Filled 2014-01-22 (×3): qty 1

## 2014-01-22 NOTE — Progress Notes (Signed)
01/22/14 1600  PT Visit Information  Last PT Received On 01/22/14  Reason Eval/Treat Not Completed Patient declined, no reason specified

## 2014-01-22 NOTE — Progress Notes (Signed)
Clinical Social Work Department CLINICAL SOCIAL WORK PLACEMENT NOTE 01/22/2014  Patient:  [name], [name]  Account Number:  [id number] Admit date:  01/21/2014  Clinical Social Worker:  Werner Lean, LCSW  Date/time:  01/22/2014 02:47 PM  Clinical Social Work is seeking post-discharge placement for this patient at the following level of care:   SKILLED NURSING   (*CSW will update this form in Epic as items are completed)     Patient/family provided with [place] Department of Clinical Social Work's list of facilities offering this level of care within the geographic area requested by the patient (or if unable, by the patient's family).  01/22/2014  Patient/family informed of their freedom to choose among providers that offer the needed level of care, that participate in Medicare, Medicaid or managed care program needed by the patient, have an available bed and are willing to accept the patient.    Patient/family informed of [facility]' ownership interest in [facility], as well as of the fact that they are under no obligation to receive care at this facility.  PASARR submitted to EDS on  PASARR number received on   FL2 transmitted to all facilities in geographic area requested by pt/family on  01/22/2014 FL2 transmitted to all facilities within larger geographic area on   Patient informed that his/her managed care company has contracts with or will negotiate with  certain facilities, including the following:     Patient/family informed of bed offers received:  01/22/2014 Patient chooses bed at [place] Physician recommends and patient chooses bed at    Patient to be transferred to  [place]  on   Patient to be transferred to facility by  Patient and family notified of transfer on  Name of family member notified:    The following physician request were entered in Epic:   Additional Comments:  Werner Lean LCSW [phone]

## 2014-01-22 NOTE — Evaluation (Signed)
Physical Therapy Evaluation Patient Details Name: [name] MRN: [id number] DOB: 01-07-1925 Today's Date: 01/22/2014   History of Present Illness  80 yo female s/p R TKA; PMHx: spinal stenosis, HTN, shoulder surgery  Clinical Impression  Pt will benefit from PT to address deficits below; Plan is for SNF post acute, PT in agreement with this plan    Follow Up Recommendations SNF    Equipment Recommendations  None recommended by PT    Recommendations for Other Services       Precautions / Restrictions Precautions Precautions: Knee Restrictions Other Position/Activity Restrictions: WBAT      Mobility  Bed Mobility               General bed mobility comments: up to chair with NT  Transfers Overall transfer level: Needs assistance Equipment used: Rolling walker (2 wheeled) Transfers: Sit to/from Stand Sit to Stand: Min assist         General transfer comment: cues for hand placement and wt shift  Ambulation/Gait Ambulation/Gait assistance: Min assist Ambulation Distance (Feet): 22 Feet Assistive device: Rolling walker (2 wheeled) Gait Pattern/deviations: Trunk flexed;Step-to pattern;Decreased step length - right;Decreased step length - left;Decreased weight shift to right Gait velocity: decr   General Gait Details: cues for sequence, posture, RW position  Stairs            Wheelchair Mobility    Modified Rankin (Stroke Patients Only)       Balance                                             Pertinent Vitals/Pain 5/10 right knee    Home Living Family/patient expects to be discharged to:: Skilled nursing facility Living Arrangements: Alone                    Prior Function Level of Independence: Independent               Hand Dominance        Extremity/Trunk Assessment   Upper Extremity Assessment: Defer to OT evaluation           Lower Extremity Assessment: RLE deficits/detail RLE  Deficits / Details: able to assist with SLR; ankle WFL; knee flexion AAROM to ~40*       Communication   Communication: No difficulties  Cognition Arousal/Alertness: Awake/alert Behavior During Therapy: WFL for tasks assessed/performed Overall Cognitive Status: Within Functional Limits for tasks assessed                      General Comments      Exercises Total Joint Exercises Ankle Circles/Pumps: AROM;Both;10 reps Quad Sets: 10 reps;AROM;Both      Assessment/Plan    PT Assessment Patient needs continued PT services  PT Diagnosis Difficulty walking   PT Problem List Decreased strength;Decreased activity tolerance;Decreased balance;Decreased mobility;Decreased knowledge of use of DME;Decreased range of motion;Decreased knowledge of precautions  PT Treatment Interventions DME instruction;Gait training;Functional mobility training;Therapeutic activities;Therapeutic exercise;Patient/family education   PT Goals (Current goals can be found in the Care Plan section) Acute Rehab PT Goals Patient Stated Goal: rehab then home I PT Goal Formulation: With patient Time For Goal Achievement: 01/29/14 Potential to Achieve Goals: Good    Frequency 7X/week   Barriers to discharge        Co-evaluation  End of Session Equipment Utilized During Treatment: Gait belt Activity Tolerance: Patient tolerated treatment well Patient left: in chair;with call bell/phone within reach Nurse Communication: Mobility status         Time: 0935-1003 PT Time Calculation (min): 28 min   Charges:   PT Evaluation $Initial PT Evaluation Tier I: 1 Procedure PT Treatments $Gait Training: 23-37 mins   PT G Codes:          [name] February 10, 2014, 10:12 AM

## 2014-01-22 NOTE — Progress Notes (Signed)
OT Cancellation Note  Patient Details Name: [name] MRN: [id number] DOB: Jul 20, 1925   Cancelled Treatment:    Reason Eval/Treat Not Completed: Other (comment)  Pt is Medicare/Medicaid and current D/C plan is SNF. No apparent immediate acute care OT needs, therefore will defer OT to SNF. If OT eval is needed please call Acute Rehab Dept. at [facility] 01/22/2014, 10:30 AM Lesle Chris, OTR/L [phone] 01/22/2014

## 2014-01-22 NOTE — Progress Notes (Signed)
   Subjective: 1 Day Post-Op Procedure(s) (LRB): RIGHT TOTAL KNEE ARTHROPLASTY (Right)   Patient reports pain as mild, pain controlled. No events throughout the night. Denies any SOB or CP.  Objective:   VITALS:   Filed Vitals:   01/22/14 0531  BP: 119/76  Pulse: 72  Temp: 98.6 F (37 C)  Resp: 16    Neurovascular intact Dorsiflexion/Plantar flexion intact Incision: dressing C/D/I No cellulitis present Compartment soft  LABS  Recent Labs  01/22/14 0430  HGB 10.9*  HCT 32.9*  WBC 11.9*  PLT 204     Recent Labs  01/22/14 0430  NA 137  K 4.0  BUN 15  CREATININE 0.75  GLUCOSE 139*     Assessment/Plan: 1 Day Post-Op Procedure(s) (LRB): RIGHT TOTAL KNEE ARTHROPLASTY (Right) Foley cath d/c'ed Advance diet Up with therapy D/C IV fluids Discharge to SNF eventually, when ready  Expected ABLA  Treated with iron and will observe  Overweight (BMI 25-29.9) Estimated body mass index is 26.14 kg/(m^2) as calculated from the following:   Height as of this encounter: 5\' 3"  (1.6 m).   Weight as of this encounter: 66.906 kg (147 lb 8 oz). Patient also counseled that weight may inhibit the healing process Patient counseled that losing weight will help with future health issues       [place]. Matan Steen   PAC  01/22/2014, 8:30 AM

## 2014-01-22 NOTE — Progress Notes (Signed)
Clinical Social Work Department BRIEF PSYCHOSOCIAL ASSESSMENT 01/22/2014  Patient:  [name], [name]     Account Number:  [id number]     Admit date:  01/21/2014  Clinical Social Worker:  Lacie Scotts  Date/Time:  01/22/2014 02:40 PM  Referred by:  Physician  Date Referred:  01/22/2014 Referred for  SNF Placement   Other Referral:   Interview type:   Other interview type:    PSYCHOSOCIAL DATA Living Status:  ALONE Admitted from facility:   Level of care:   Primary support name:  Jonna Munro Primary support relationship to patient:  CHILD, ADULT Degree of support available:   supportive    CURRENT CONCERNS Current Concerns  Post-Acute Placement   Other Concerns:    SOCIAL WORK ASSESSMENT / PLAN Pt is an 80 yr old female living at home prior to hospitalization. CSW met with pt to assist with d/c planning. This is a planned admission. Pt has made prior arrangements to have ST Rehab at [facility] in [place] following hospital d/c. CSW has contacted SNF and d/c plans have been confirmed. CSW will continue to follow to assist with d/c planning to SNF.   Assessment/plan status:  Psychosocial Support/Ongoing Assessment of Needs Other assessment/ plan:   Information/referral to community resources:   Insurance coverage for SNF and ambulance transport has been reviewed.    PATIENT'S/FAMILY'S RESPONSE TO PLAN OF CARE: Pt is motivated to work with therapist. She has been to [facility] in the past and is looking forward to returning.   Werner Lean LCSW [phone]

## 2014-01-23 LAB — BASIC METABOLIC PANEL
BUN: 15 mg/dL (ref 6–23)
CO2: 27 mEq/L (ref 19–32)
Calcium: 9.4 mg/dL (ref 8.4–10.5)
Chloride: 104 mEq/L (ref 96–112)
Creatinine, Ser: 0.7 mg/dL (ref 0.50–1.10)
GFR calc Af Amer: 88 mL/min — ABNORMAL LOW (ref >90)
GFR calc non Af Amer: 76 mL/min — ABNORMAL LOW (ref >90)
Glucose, Bld: 123 mg/dL — ABNORMAL HIGH (ref 70–99)
POTASSIUM: 4.3 meq/L (ref 3.7–5.3)
Sodium: 140 mEq/L (ref 137–147)

## 2014-01-23 LAB — CBC
HCT: 30.5 % — ABNORMAL LOW (ref 36.0–46.0)
Hemoglobin: 9.9 g/dL — ABNORMAL LOW (ref 12.0–15.0)
MCH: 30.4 pg (ref 26.0–34.0)
MCHC: 32.5 g/dL (ref 30.0–36.0)
MCV: 93.6 fL (ref 78.0–100.0)
Platelets: 182 10*3/uL (ref 150–400)
RBC: 3.26 MIL/uL — AB (ref 3.87–5.11)
RDW: 13.5 % (ref 11.5–15.5)
WBC: 10.5 10*3/uL (ref 4.0–10.5)

## 2014-01-23 MED ORDER — TIZANIDINE HCL 4 MG PO CAPS: 4.0000 mg | ORAL_CAPSULE | Freq: Three times a day (TID) | ORAL | Status: DC | PRN

## 2014-01-23 MED ORDER — FERROUS SULFATE 325 (65 FE) MG PO TABS: 325.0000 mg | ORAL_TABLET | Freq: Three times a day (TID) | ORAL | Status: DC

## 2014-01-23 MED ORDER — DSS 100 MG PO CAPS: 100.0000 mg | ORAL_CAPSULE | Freq: Two times a day (BID) | ORAL | Status: DC

## 2014-01-23 MED ORDER — POLYETHYLENE GLYCOL 3350 17 G PO PACK: 17.0000 g | PACK | Freq: Two times a day (BID) | ORAL | Status: DC

## 2014-01-23 MED ORDER — ASPIRIN 325 MG PO TBEC
325.0000 mg | DELAYED_RELEASE_TABLET | Freq: Two times a day (BID) | ORAL | Status: AC
Start: 2014-01-23 — End: 2014-02-19

## 2014-01-23 MED ORDER — HYDROCODONE-ACETAMINOPHEN 7.5-325 MG PO TABS: 1.0000 | ORAL_TABLET | ORAL | Status: DC | PRN

## 2014-01-23 NOTE — Progress Notes (Signed)
Physical Therapy Treatment Patient Details Name: MERCADES BAJAJ MRN: [id number] DOB: 02-11-1925 Today's Date: 01/31/14    History of Present Illness 80 yo female s/p R TKA; PMHx: spinal stenosis, HTN, shoulder surgery    PT Comments    Progressing, will benefit from SNF  Follow Up Recommendations  SNF     Equipment Recommendations  None recommended by PT    Recommendations for Other Services       Precautions / Restrictions Precautions Precautions: Knee;Fall Restrictions Other Position/Activity Restrictions: WBAT    Mobility  Bed Mobility                  Transfers Overall transfer level: Needs assistance Equipment used: Rolling walker (2 wheeled) Transfers: Sit to/from Stand Sit to Stand: Min assist;Mod assist         General transfer comment: cues for hand placement and wt shift  Ambulation/Gait Ambulation/Gait assistance: Min assist Ambulation Distance (Feet): 80 Feet Assistive device: Rolling walker (2 wheeled) Gait Pattern/deviations: Step-to pattern;Step-through pattern;Decreased stride length;Trunk flexed Gait velocity: decr; pt does not agree to a walker of shorter ht   General Gait Details: cues for sequence, posture, RW position   Stairs            Wheelchair Mobility    Modified Rankin (Stroke Patients Only)       Balance                                    Cognition Arousal/Alertness: Awake/alert Behavior During Therapy: WFL for tasks assessed/performed Overall Cognitive Status: Within Functional Limits for tasks assessed                      Exercises Total Joint Exercises Ankle Circles/Pumps: AROM;Both;10 reps Quad Sets: 10 reps;AROM;Both Heel Slides: AROM;Right;10 reps Hip ABduction/ADduction: AROM;Strengthening;Right;10 reps Straight Leg Raises: AAROM;Strengthening;Right;10 reps    General Comments        Pertinent Vitals/Pain Sore but better after meds--L knee     Home Living                       Prior Function            PT Goals (current goals can now be found in the care plan section) Acute Rehab PT Goals Patient Stated Goal: rehab then home I PT Goal Formulation: With patient Time For Goal Achievement: 01/29/14 Potential to Achieve Goals: Good Progress towards PT goals: Progressing toward goals    Frequency  7X/week    PT Plan Current plan remains appropriate    Co-evaluation             End of Session Equipment Utilized During Treatment: Gait belt Activity Tolerance: Patient tolerated treatment well Patient left: in chair;with call bell/phone within reach     Time: 1133-1156 PT Time Calculation (min): 23 min  Charges:  $Gait Training: 8-22 mins $Therapeutic Exercise: 8-22 mins                    G Codes:      Sarah Zerby Jan 31, 2014, 1:48 PM

## 2014-01-23 NOTE — Discharge Summary (Signed)
Physician Discharge Summary  Patient ID: [name] MRN: [id number] DOB/AGE: July 14, 1925 80 y.o.  Admit date: 01/21/2014 Discharge date:  01/24/2014   Procedures:  Procedure(s) (LRB): RIGHT TOTAL KNEE ARTHROPLASTY (Right)  Attending Physician:  Dr. Paralee Cancel   Admission Diagnoses:   Right knee OA / pain  Discharge Diagnoses:  Principal Problem:   S/P right TKA Active Problems:   Overweight (BMI 25.0-29.9)   Expected blood loss anemia  Past Medical History  Diagnosis Date  . Spinal stenosis   . Spondylosis of lumbosacral region   . Scoliosis   . Shoulder pain   . Osteoarthritis     both knees  . Knee pain     bilateral  . Heart murmur   . Hypothyroidism   . Chronic pain   . Polyneuropathy in other diseases classified elsewhere 01/02/2014  . Difficulty swallowing     in the morning  . Glaucoma   . Hypertension     "not high- on medication for precaution"  . GERD (gastroesophageal reflux disease)     occasional  . H/O rotator cuff tear 10 years ago    right  . Equilibrium disorder   . Double vision     right eye    HPI: [name], 80 y.o. female, has a history of pain and functional disability in the right knee due to arthritis and has failed non-surgical conservative treatments for greater than 12 weeks to include NSAID's and/or analgesics, corticosteriod injections, viscosupplementation injections, use of assistive devices and activity modification. Onset of symptoms was gradual, starting 2+ years ago with gradually worsening course since that time. The patient noted no past surgery on the right knee(s). Patient currently rates pain in the right knee(s) at 10 out of 10 with activity. Patient has night pain, worsening of pain with activity and weight bearing, pain that interferes with activities of daily living and pain with passive range of motion. Patient has evidence of periarticular osteophytes and joint space narrowing by imaging studies. There is no  active infection. Risks, benefits and expectations were discussed with the patient. Risks including but not limited to the risk of anesthesia, blood clots, nerve damage, blood vessel damage, failure of the prosthesis, infection and up to and including death. Patient understand the risks, benefits and expectations and wishes to proceed with surgery.    PCP: [name], MD   Discharged Condition: good  Hospital Course:  Patient underwent the above stated procedure on 01/21/2014. Patient tolerated the procedure well and brought to the recovery room in good condition and subsequently to the floor.  POD #1 BP: 119/76 ; Pulse: 72 ; Temp: 98.6 F (37 C) ; Resp: 16  Patient reports pain as mild, pain controlled. No events throughout the night. Denies any SOB or CP. Neurovascular intact, dorsiflexion/plantar flexion intact, incision: dressing C/D/I, no cellulitis present and compartment soft.   LABS  Basename    HGB  10.9  HCT  32.9   POD #2  BP: 103/63 ; Pulse: 70 ; Temp: 97.5 F (36.4 C) ; Resp: 18 Patient reports pain as moderate, noted both thigh and calf discomfort. No events. Neurovascular intact, dorsiflexion/plantar flexion intact, incision: dressing C/D/I, no cellulitis present and compartment soft.   LABS  Basename    HGB  9.9  HCT  30.5   POD #3  BP: 111/71 ; Pulse: 70 ; Temp: 98.1 F (36.7 C) ; Resp: 17  Patient reports pain as moderate. Still working on therapy. Looking forward  to next step. Ready to be discharged to skilled nursing facility.  Neurovascular intact and incision: dressing C/D/I  LABS   No new labs   Discharge Exam: General appearance: alert, cooperative and no distress Extremities: Homans sign is negative, no sign of DVT, no edema, redness or tenderness in the calves or thighs and no ulcers, gangrene or trophic changes  Disposition:  Skilled nursing facility with follow up in 2 weeks   Follow-up Information   Follow up with Mauri Pole, MD.  Schedule an appointment as soon as possible for a visit in 2 weeks.   Specialty:  Orthopedic Surgery   Contact information:   [address] [place] [address] [phone]       Discharge Instructions   Call MD / Call 911    Complete by:  As directed   If you experience chest pain or shortness of breath, CALL 911 and be transported to the hospital emergency room.  If you develope a fever above 101 F, pus (white drainage) or increased drainage or redness at the wound, or calf pain, call your surgeon's office.     Change dressing    Complete by:  As directed   Maintain surgical dressing for 10-14 days, or until follow up in the clinic.     Constipation Prevention    Complete by:  As directed   Drink plenty of fluids.  Prune juice may be helpful.  You may use a stool softener, such as Colace (over the counter) 100 mg twice a day.  Use MiraLax (over the counter) for constipation as needed.     Diet - low sodium heart healthy    Complete by:  As directed      Discharge instructions    Complete by:  As directed   Maintain surgical dressing for 10-14 days, or until follow up in the clinic. Follow up in 2 weeks at [facility]. Call with any questions or concerns.     Driving restrictions    Complete by:  As directed   No driving for 4 weeks     Increase activity slowly as tolerated    Complete by:  As directed      TED hose    Complete by:  As directed   Use stockings (TED hose) for 2 weeks on both leg(s).  You may remove them at night for sleeping.     Weight bearing as tolerated    Complete by:  As directed              Medication List    STOP taking these medications       EXCEDRIN EXTRA STRENGTH 250-250-65 MG per tablet  Generic drug:  aspirin-acetaminophen-caffeine     HYDROcodone-acetaminophen 5-325 MG per tablet  Commonly known as:  NORCO/VICODIN  Replaced by:  HYDROcodone-acetaminophen 7.5-325 MG per tablet     traMADol 50 MG tablet    Commonly known as:  ULTRAM      TAKE these medications       aspirin 325 MG EC tablet  Take 1 tablet (325 mg total) by mouth 2 (two) times daily.     brinzolamide 1 % ophthalmic suspension  Commonly known as:  AZOPT  Place 1 drop into both eyes 2 (two) times daily.     Calcium + D3 600-200 MG-UNIT Tabs  Take 1 tablet by mouth daily.     DSS 100 MG Caps  Take 100 mg by mouth 2 (two) times daily.  Fish Oil 1000 MG Caps  Take 1,000 mg by mouth daily.     gabapentin 100 MG capsule  Commonly known as:  NEURONTIN  Take 1 capsule (100 mg total) by mouth 3 (three) times daily.     Garlic 202 MG Tabs  Take by mouth.     hydrochlorothiazide 25 MG tablet  Commonly known as:  HYDRODIURIL  Take 12.5 mg by mouth every morning.     HYDROcodone-acetaminophen 7.5-325 MG per tablet  Commonly known as:  NORCO  Take 1-2 tablets by mouth every 4 (four) hours as needed for moderate pain.     levothyroxine 75 MCG tablet  Commonly known as:  SYNTHROID, LEVOTHROID  Take 75 mcg by mouth daily before breakfast.     Magnesium 250 MG Tabs  Take 1 tablet by mouth daily.     moexipril 7.5 MG tablet  Commonly known as:  UNIVASC  Take 7.5 mg by mouth daily.     multivitamin with minerals Tabs tablet  Take 1 tablet by mouth daily.     omeprazole 20 MG capsule  Commonly known as:  PRILOSEC  Take 20 mg by mouth every morning.     polyethylene glycol packet  Commonly known as:  MIRALAX / GLYCOLAX  Take 17 g by mouth 2 (two) times daily.     pravastatin 40 MG tablet  Commonly known as:  PRAVACHOL  Take 40 mg by mouth at bedtime.     tiZANidine 4 MG capsule  Commonly known as:  ZANAFLEX  Take 1 capsule (4 mg total) by mouth 3 (three) times daily as needed for muscle spasms.     vitamin B-12 1000 MCG tablet  Commonly known as:  CYANOCOBALAMIN  Take 1,000 mcg by mouth daily.     vitamin C 500 MG tablet  Commonly known as:  ASCORBIC ACID  Take 500 mg by mouth daily.           Signed: West Pugh. Dhyana Bastone   PA-C  01/24/2014, 7:28 AM

## 2014-01-23 NOTE — Progress Notes (Addendum)
01/23/14 1400  PT Visit Information  Last PT Received On 01/23/14  Assistance Needed +1  History of Present Illness 80 yo female s/p R TKA; PMHx: spinal stenosis, HTN, shoulder surgery  PT Time Calculation  PT Start Time 1435  PT Stop Time 1450  PT Time Calculation (min) 15 min  Subjective Data  Patient Stated Goal rehab then home I  Precautions  Precautions Knee;Fall  Pt continues to c/o pain of calf pain right LE  Restrictions  Other Position/Activity Restrictions WBAT  Cognition  Arousal/Alertness Awake/alert  Behavior During Therapy WFL for tasks assessed/performed  Overall Cognitive Status Within Functional Limits for tasks assessed  Bed Mobility  Overal bed mobility Needs Assistance  Bed Mobility Sit to Supine  Sit to supine Supervision  General bed mobility comments for safety  Transfers  Overall transfer level Needs assistance  Equipment used Rolling walker (2 wheeled)  Transfers Sit to/from Stand  Sit to Stand Min assist (2 attempts)  General transfer comment cues for hand placement and wt shift  Ambulation/Gait  Ambulation/Gait assistance Min assist  Ambulation Distance (Feet) 60 Feet  Assistive device Rolling walker (2 wheeled)  Gait Pattern/deviations Step-to pattern;Step-through pattern;Decreased stride length;Trunk flexed  General Gait Details cues for sequence, posture, RW position  PT - End of Session  Equipment Utilized During Treatment Gait belt  Activity Tolerance Patient tolerated treatment well  Patient left in bed;with call bell/phone within reach  PT - Assessment/Plan  PT Plan Current plan remains appropriate  PT Frequency 7X/week  Follow Up Recommendations SNF  PT equipment None recommended by PT  PT Goal Progression  Progress towards PT goals Progressing toward goals  Acute Rehab PT Goals  PT Goal Formulation With patient  Time For Goal Achievement 01/29/14  Potential to Achieve Goals Good  PT General Charges  $$ ACUTE PT VISIT 1  Procedure  PT Treatments  $Gait Training 8-22 mins

## 2014-01-23 NOTE — Progress Notes (Signed)
Patient ID: [name], female   DOB: 02-06-1925, 80 y.o.   MRN: [id number] Subjective: 2 Days Post-Op Procedure(s) (LRB): RIGHT TOTAL KNEE ARTHROPLASTY (Right)    Patient reports pain as moderate, noted both thigh and calf discomfort.  No events  Objective:   VITALS:   Filed Vitals:   01/23/14 0544  BP: 103/63  Pulse: 70  Temp: 97.5 F (36.4 C)  Resp: 18    Neurovascular intact Incision: dressing C/D/I  LABS  Recent Labs  01/22/14 0430 01/23/14 0412  HGB 10.9* 9.9*  HCT 32.9* 30.5*  WBC 11.9* 10.5  PLT 204 182     Recent Labs  01/22/14 0430 01/23/14 0412  NA 137 140  K 4.0 4.3  BUN 15 15  CREATININE 0.75 0.70  GLUCOSE 139* 123*    No results found for this basename: LABPT, INR,  in the last 72 hours   Assessment/Plan: 2 Days Post-Op Procedure(s) (LRB): RIGHT TOTAL KNEE ARTHROPLASTY (Right)   Up with therapy Discharge to SNF tomorrow  Reviewed exercises, stretching. Pillow under ankle to allow for posterior stretching

## 2014-01-24 NOTE — Progress Notes (Signed)
Clinical Social Work Department CLINICAL SOCIAL WORK PLACEMENT NOTE 01/24/2014  Patient:  [name], [name]  Account Number:  [id number] Admit date:  01/21/2014  Clinical Social Worker:  Werner Lean, LCSW  Date/time:  01/22/2014 02:47 PM  Clinical Social Work is seeking post-discharge placement for this patient at the following level of care:   SKILLED NURSING   (*CSW will update this form in Epic as items are completed)     Patient/family provided with [facility] Department of Clinical Social Work's list of facilities offering this level of care within the geographic area requested by the patient (or if unable, by the patient's family).  01/22/2014  Patient/family informed of their freedom to choose among providers that offer the needed level of care, that participate in Medicare, Medicaid or managed care program needed by the patient, have an available bed and are willing to accept the patient.    Patient/family informed of [facility]' ownership interest in [facility], as well as of the fact that they are under no obligation to receive care at this facility.  PASARR submitted to EDS on  PASARR number received on   FL2 transmitted to all facilities in geographic area requested by pt/family on  01/22/2014 FL2 transmitted to all facilities within larger geographic area on   Patient informed that his/her managed care company has contracts with or will negotiate with  certain facilities, including the following:     Patient/family informed of bed offers received:  01/22/2014 Patient chooses bed at [facility] Physician recommends and patient chooses bed at    Patient to be transferred to [facility] on  01/24/2014 Patient to be transferred to facility by [facility] Patient and family notified of transfer on 01/24/2014 Name of family member notified:  Jonna Munro  The following physician request were entered in Epic:   Additional  Comments:  Pt / son are in agreemnt with d/c to SNF today. NSG reviewed d/c summary, avs ,scripts. Scrpits included in d/c packet.  Werner Lean LCSW [phone]

## 2014-01-24 NOTE — Progress Notes (Signed)
Patient ID: [name], female   DOB: 1925-08-05, 80 y.o.   MRN: [id number] Subjective: 3 Days Post-Op Procedure(s) (LRB): RIGHT TOTAL KNEE ARTHROPLASTY (Right)    Patient reports pain as moderate.  Still working on therapy.  Looking forward to next step  Objective:   VITALS:   Filed Vitals:   01/24/14 0520  BP: 111/71  Pulse: 70  Temp: 98.1 F (36.7 C)  Resp: 17    Neurovascular intact Incision: dressing C/D/I  LABS  Recent Labs  01/22/14 0430 01/23/14 0412  HGB 10.9* 9.9*  HCT 32.9* 30.5*  WBC 11.9* 10.5  PLT 204 182     Recent Labs  01/22/14 0430 01/23/14 0412  NA 137 140  K 4.0 4.3  BUN 15 15  CREATININE 0.75 0.70  GLUCOSE 139* 123*    No results found for this basename: LABPT, INR,  in the last 72 hours   Assessment/Plan: 3 Days Post-Op Procedure(s) (LRB): RIGHT TOTAL KNEE ARTHROPLASTY (Right)   Up with therapy Discharge to SNF today  Stressed activity specifics RTC 2 weeks

## 2014-06-06 ENCOUNTER — Observation Stay (HOSPITAL_COMMUNITY)
Admission: EM | Admit: 2014-06-06 | Discharge: 2014-06-09 | Disposition: A | Discharge status: Home / Self Care | Payer: Medicare Other | Attending: Internal Medicine | Admitting: Internal Medicine

## 2014-06-06 ENCOUNTER — Encounter (HOSPITAL_COMMUNITY): Payer: Self-pay | Admitting: Emergency Medicine

## 2014-06-06 ENCOUNTER — Emergency Department (HOSPITAL_COMMUNITY): Payer: Medicare Other

## 2014-06-06 DIAGNOSIS — M4800 Spinal stenosis, site unspecified: Secondary | ICD-10-CM | POA: Insufficient documentation

## 2014-06-06 DIAGNOSIS — M25519 Pain in unspecified shoulder: Secondary | ICD-10-CM | POA: Diagnosis not present

## 2014-06-06 DIAGNOSIS — K219 Gastro-esophageal reflux disease without esophagitis: Secondary | ICD-10-CM | POA: Insufficient documentation

## 2014-06-06 DIAGNOSIS — M479 Spondylosis, unspecified: Secondary | ICD-10-CM | POA: Diagnosis not present

## 2014-06-06 DIAGNOSIS — I10 Essential (primary) hypertension: Secondary | ICD-10-CM

## 2014-06-06 DIAGNOSIS — Z79899 Other long term (current) drug therapy: Secondary | ICD-10-CM | POA: Insufficient documentation

## 2014-06-06 DIAGNOSIS — R109 Unspecified abdominal pain: Secondary | ICD-10-CM

## 2014-06-06 DIAGNOSIS — M170 Bilateral primary osteoarthritis of knee: Secondary | ICD-10-CM | POA: Insufficient documentation

## 2014-06-06 DIAGNOSIS — K469 Unspecified abdominal hernia without obstruction or gangrene: Secondary | ICD-10-CM | POA: Diagnosis not present

## 2014-06-06 DIAGNOSIS — H409 Unspecified glaucoma: Secondary | ICD-10-CM | POA: Insufficient documentation

## 2014-06-06 DIAGNOSIS — G8929 Other chronic pain: Secondary | ICD-10-CM

## 2014-06-06 DIAGNOSIS — E038 Other specified hypothyroidism: Secondary | ICD-10-CM

## 2014-06-06 DIAGNOSIS — R1031 Right lower quadrant pain: Secondary | ICD-10-CM | POA: Diagnosis present

## 2014-06-06 DIAGNOSIS — E039 Hypothyroidism, unspecified: Secondary | ICD-10-CM

## 2014-06-06 DIAGNOSIS — M419 Scoliosis, unspecified: Secondary | ICD-10-CM | POA: Insufficient documentation

## 2014-06-06 DIAGNOSIS — R011 Cardiac murmur, unspecified: Secondary | ICD-10-CM | POA: Diagnosis present

## 2014-06-06 DIAGNOSIS — Z7982 Long term (current) use of aspirin: Secondary | ICD-10-CM | POA: Insufficient documentation

## 2014-06-06 LAB — COMPREHENSIVE METABOLIC PANEL
ALBUMIN: 3.7 g/dL (ref 3.5–5.2)
ALT: 16 U/L (ref 0–35)
AST: 22 U/L (ref 0–37)
Alkaline Phosphatase: 46 U/L (ref 39–117)
Anion gap: 12 (ref 5–15)
BUN: 23 mg/dL (ref 6–23)
CO2: 25 mEq/L (ref 19–32)
Calcium: 10 mg/dL (ref 8.4–10.5)
Chloride: 102 mEq/L (ref 96–112)
Creatinine, Ser: 0.79 mg/dL (ref 0.50–1.10)
GFR calc Af Amer: 84 mL/min — ABNORMAL LOW (ref >90)
GFR calc non Af Amer: 73 mL/min — ABNORMAL LOW (ref >90)
Glucose, Bld: 108 mg/dL — ABNORMAL HIGH (ref 70–99)
POTASSIUM: 4.1 meq/L (ref 3.7–5.3)
Sodium: 139 mEq/L (ref 137–147)
Total Bilirubin: 0.2 mg/dL — ABNORMAL LOW (ref 0.3–1.2)
Total Protein: 6.7 g/dL (ref 6.0–8.3)

## 2014-06-06 LAB — CBC WITH DIFFERENTIAL/PLATELET
BASOS PCT: 0 % (ref 0–1)
Basophils Absolute: 0 10*3/uL (ref 0.0–0.1)
Eosinophils Absolute: 0.3 10*3/uL (ref 0.0–0.7)
Eosinophils Relative: 4 % (ref 0–5)
HCT: 36.8 % (ref 36.0–46.0)
HEMOGLOBIN: 12.1 g/dL (ref 12.0–15.0)
LYMPHS PCT: 30 % (ref 12–46)
Lymphs Abs: 2.4 10*3/uL (ref 0.7–4.0)
MCH: 30 pg (ref 26.0–34.0)
MCHC: 32.9 g/dL (ref 30.0–36.0)
MCV: 91.1 fL (ref 78.0–100.0)
MONOS PCT: 12 % (ref 3–12)
Monocytes Absolute: 1 10*3/uL (ref 0.1–1.0)
NEUTROS ABS: 4.4 10*3/uL (ref 1.7–7.7)
NEUTROS PCT: 54 % (ref 43–77)
Platelets: 237 10*3/uL (ref 150–400)
RBC: 4.04 MIL/uL (ref 3.87–5.11)
RDW: 14.3 % (ref 11.5–15.5)
WBC: 8.1 10*3/uL (ref 4.0–10.5)

## 2014-06-06 LAB — LIPASE, BLOOD: Lipase: 21 U/L (ref 11–59)

## 2014-06-06 MED ORDER — ONDANSETRON HCL 4 MG/2ML IJ SOLN
4.0000 mg | Freq: Once | INTRAMUSCULAR | Status: AC
  Administered 2014-06-06: 4 mg via INTRAVENOUS
  Filled 2014-06-06: qty 2

## 2014-06-06 MED ORDER — FENTANYL CITRATE 0.05 MG/ML IJ SOLN
50.0000 ug | Freq: Once | INTRAMUSCULAR | Status: AC
  Administered 2014-06-06: 50 ug via INTRAVENOUS
  Filled 2014-06-06: qty 2

## 2014-06-06 MED ORDER — IOHEXOL 300 MG/ML  SOLN
100.0000 mL | Freq: Once | INTRAMUSCULAR | Status: AC | PRN
  Administered 2014-06-06: 100 mL via INTRAVENOUS

## 2014-06-06 MED ORDER — IOHEXOL 300 MG/ML  SOLN: 50.0000 mL | Freq: Once | INTRAMUSCULAR | Status: AC | PRN

## 2014-06-06 NOTE — ED Notes (Signed)
Pt. Came in with complained of abdominal pain  At 10/10 on RLQ, pt. Claimed that she had hernia 3 years ago  And that's where its hurting now. Pt. Denies nausea nor vomiting, had 2 bowel movements today but regular.  No s/s of SOB.

## 2014-06-06 NOTE — ED Provider Notes (Signed)
CSN: [id number]     Arrival date & time 06/06/14  2016 History   First MD Initiated Contact with Patient 06/06/14 2145     Chief Complaint  Patient presents with  . Abdominal Pain  . Hernia     (Consider location/radiation/quality/duration/timing/severity/associated sxs/prior Treatment) HPI [name] is a 80 y.o. female with history of abdominal hernia, htn, chronic pain,  presents to emergency department complaining of abdominal pain. Pt states she is from [place], [place]. States she has seen a Psychologist, sport and exercise for this hernia, and her symptoms are treated symptomatically. States pain in the hernia comes and goes. States has been there for over 3 years. Pt states this afternoon, pain worsened, and her family brought her here to see a Psychologist, sport and exercise. Pt states she did not try to reduce it. Took her regular pain medication, norco around noon with no relief. Denies nausea, vomiting. Had two bm this morning, but none since pain started. Denies fever, chills.   Past Medical History  Diagnosis Date  . Spinal stenosis   . Spondylosis of lumbosacral region   . Scoliosis   . Shoulder pain   . Osteoarthritis     both knees  . Knee pain     bilateral  . Heart murmur   . Hypothyroidism   . Chronic pain   . Polyneuropathy in other diseases classified elsewhere 01/02/2014  . Difficulty swallowing     in the morning  . Glaucoma   . Hypertension     "not high- on medication for precaution"  . GERD (gastroesophageal reflux disease)     occasional  . H/O rotator cuff tear 10 years ago    right  . Equilibrium disorder   . Double vision     right eye   Past Surgical History  Procedure Laterality Date  . Oophorectomy    . Cataract extraction      bilateral  . Dilation and curettage of uterus    . Esophageal dilation  ~2012  . Inguinal hernia repair Bilateral   . Shoulder surgery Left     "rebuilt"  . Total abdominal hysterectomy  1991    with bladder tac  . Total knee arthroplasty Right 01/21/2014     Procedure: RIGHT TOTAL KNEE ARTHROPLASTY;  Surgeon: Mauri Pole, MD;  Location: [facility];  Service: Orthopedics;  Laterality: Right;   Family History  Problem Relation Age of Onset  . CVA Mother   . CVA Father   . Cancer Brother    History  Substance Use Topics  . Smoking status: Never Smoker   . Smokeless tobacco: Never Used  . Alcohol Use: No   OB History   Grav Para Term Preterm Abortions TAB SAB Ect Mult Living                 Review of Systems  Constitutional: Negative for fever and chills.  Respiratory: Negative for cough, chest tightness and shortness of breath.   Cardiovascular: Negative for chest pain, palpitations and leg swelling.  Gastrointestinal: Positive for abdominal pain. Negative for nausea, vomiting, diarrhea and constipation.  Genitourinary: Negative for dysuria and flank pain.  Musculoskeletal: Negative for arthralgias, myalgias, neck pain and neck stiffness.  Skin: Negative for rash.  Neurological: Negative for dizziness, weakness and headaches.  All other systems reviewed and are negative.     Allergies  Review of patient's allergies indicates no known allergies.  Home Medications   Prior to Admission medications   Medication Sig Start Date End  Date Taking? Authorizing Provider  aspirin-acetaminophen-caffeine (EXCEDRIN EXTRA STRENGTH) [phone] MG per tablet Take 1 tablet by mouth every 6 (six) hours as needed for headache (pain).   Yes Historical Provider, MD  brinzolamide (AZOPT) 1 % ophthalmic suspension Place 1 drop into both eyes 2 (two) times daily.    Yes Historical Provider, MD  Calcium Carb-Cholecalciferol (CALCIUM + D3) 600-200 MG-UNIT TABS Take 1 tablet by mouth daily.   Yes Historical Provider, MD  docusate sodium 100 MG CAPS Take 100 mg by mouth 2 (two) times daily. 01/23/14  Yes Lucille Passy Babish, PA-C  gabapentin (NEURONTIN) 100 MG capsule Take 1 capsule (100 mg total) by mouth 3 (three) times daily. 01/02/14  Yes Kathrynn Ducking, MD  Garlic 443 MG TABS Take 100 mg by mouth daily.    Yes Historical Provider, MD  hydrochlorothiazide (HYDRODIURIL) 25 MG tablet Take 12.5 mg by mouth every morning.    Yes Historical Provider, MD  HYDROcodone-acetaminophen (NORCO) 7.5-325 MG per tablet Take 1 tablet by mouth every 4 (four) hours as needed for moderate pain (hip pain).   Yes Historical Provider, MD  levothyroxine (SYNTHROID, LEVOTHROID) 75 MCG tablet Take 75 mcg by mouth daily before breakfast.   Yes Historical Provider, MD  Magnesium 250 MG TABS Take 1 tablet by mouth daily.   Yes Historical Provider, MD  moexipril (UNIVASC) 7.5 MG tablet Take 7.5 mg by mouth daily.    Yes Historical Provider, MD  Multiple Vitamin (MULTIVITAMIN WITH MINERALS) TABS tablet Take 1 tablet by mouth daily.   Yes Historical Provider, MD  Omega-3 Fatty Acids (FISH OIL) 1000 MG CAPS Take 1,000 mg by mouth daily.   Yes Historical Provider, MD  omeprazole (PRILOSEC) 20 MG capsule Take 20 mg by mouth every morning.    Yes Historical Provider, MD  pravastatin (PRAVACHOL) 40 MG tablet Take 40 mg by mouth at bedtime.    Yes Historical Provider, MD  vitamin B-12 (CYANOCOBALAMIN) 1000 MCG tablet Take 1,000 mcg by mouth daily.   Yes Historical Provider, MD  vitamin C (ASCORBIC ACID) 500 MG tablet Take 500 mg by mouth daily.   Yes Historical Provider, MD   BP 115/80  Pulse 84  Temp(Src) 98.3 F (36.8 C) (Oral)  Resp 20  SpO2 100% Physical Exam  Nursing note and vitals reviewed. Constitutional: She is oriented to person, place, and time. She appears well-developed and well-nourished. No distress.  HENT:  Head: Normocephalic.  Eyes: Conjunctivae are normal.  Neck: Neck supple.  Cardiovascular: Normal rate, regular rhythm and normal heart sounds.   Pulmonary/Chest: Effort normal and breath sounds normal. No respiratory distress. She has no wheezes. She has no rales.  Abdominal: Soft. Bowel sounds are normal. She exhibits no distension. There is  tenderness. There is no rebound and no guarding.  Hernia palpated over right mid abdomen, tender. Does not tolerate me trying to reduce it  Musculoskeletal: She exhibits no edema.  Neurological: She is alert and oriented to person, place, and time.  Skin: Skin is warm and dry.  Psychiatric: She has a normal mood and affect. Her behavior is normal.    ED Course  Procedures (including critical care time) Labs Review Labs Reviewed  COMPREHENSIVE METABOLIC PANEL - Abnormal; Notable for the following:    Glucose, Bld 108 (*)    Total Bilirubin 0.2 (*)    GFR calc non Af Amer 73 (*)    GFR calc Af Amer 84 (*)    All other components within normal  limits  CBC WITH DIFFERENTIAL  LIPASE, BLOOD  URINALYSIS, ROUTINE W REFLEX MICROSCOPIC    Imaging Review Ct Abdomen Pelvis W Contrast  06/07/2014   ADDENDUM REPORT: 06/07/2014 00:22  ADDENDUM: Corrected Findings and Impression:  FINDINGS: Mild dependent atelectasis in the lung bases. Aortic and mitral valve calcification. Small esophageal hiatal hernia. Residual contrast material in the lower esophagus suggesting reflux or dysmotility. Lower esophageal wall thickening suggesting reflux disease.  The liver, spleen, gallbladder, pancreas, adrenal glands, inferior vena cava, and retroperitoneal lymph nodes are unremarkable. Small parenchymal cysts in the kidneys. No solid mass or hydronephrosis. Calcification of the abdominal aorta without aneurysm. Stomach and small bowel appear normal. Stool-filled colon without distention or wall thickening. There is a right flank SPIGELIAN TYPE anterior abdominal wall hernia containing a portion of the ascending colon. No evidence of proximal obstruction.  Pelvis: Bladder wall is not thickened. Uterus appears surgically absent. No pelvic mass or lymphadenopathy. No free or loculated pelvic fluid collections. No evidence of diverticulitis. Degenerative changes and diffuse demineralization throughout the spine.  Spondylolysis at L4-5 with a mild spondylolisthesis.  IMPRESSION: Right lower quadrant abdominal wall SPIGELIAN type hernia containing the ascending colon without evidence of obstruction. Small esophageal hiatal hernia with changes likely due to reflux in the lower esophagus.   Electronically Signed   By: Lucienne Capers M.D.   On: 06/07/2014 00:22   06/07/2014   CLINICAL DATA:  Right lower quadrant hernia pain tonight.  EXAM: CT ABDOMEN AND PELVIS WITH CONTRAST  TECHNIQUE: Multidetector CT imaging of the abdomen and pelvis was performed using the standard protocol following bolus administration of intravenous contrast.  CONTRAST:  161mL OMNIPAQUE IOHEXOL 300 MG/ML  SOLN  COMPARISON:  02/22/2009  FINDINGS: Mild dependent atelectasis in the lung bases. Aortic and mitral valve calcification. Small esophageal hiatal hernia. Residual contrast material in the lower esophagus suggesting reflux or dysmotility. Lower esophageal wall thickening suggesting reflux disease.  The liver, spleen, gallbladder, pancreas, adrenal glands, inferior vena cava, and retroperitoneal lymph nodes are unremarkable. Small parenchymal cysts in the kidneys. No solid mass or hydronephrosis. Calcification of the abdominal aorta without aneurysm. Stomach and small bowel appear normal. Stool-filled colon without distention or wall thickening. There is a right flank speed daily in tight anterior abdominal wall hernia containing a portion of the ascending colon. No evidence of proximal obstruction.  Pelvis: Bladder wall is not thickened. Uterus appears surgically absent. No pelvic mass or lymphadenopathy. No free or loculated pelvic fluid collections. No evidence of diverticulitis. Degenerative changes and diffuse demineralization throughout the spine. Spondylolysis at L4-5 with a mild spondylolisthesis.  IMPRESSION: Right lower quadrant abdominal walls be daily in type hernia containing the ascending colon without evidence of obstruction. Small  esophageal hiatal hernia with changes likely due to reflux in the lower esophagus.  Electronically Signed: By: Lucienne Capers M.D. On: 06/06/2014 23:58     EKG Interpretation None      MDM   Final diagnoses:  Abdominal pain  Abdominal hernia without obstruction and without gangrene, recurrence not specified, unspecified hernia type      10:23 PM Pt seen and examined, here with abdominal hernia. Will not let me reduce it. Will try pain medications. Pt requesting to see a Psychologist, sport and exercise. VS normal. Afebrile. Hernia painful since this afternoon.    10:50 PM Pt received her pain medications. Hernia was reduced with firm pressure. Will continue to obtain labs and CT abd/pelvis  12:24 AM Discussed results with Dr. [name], who stated this is  a non emergent finding, advised to admit to medicine if she needs to be admitted and they will see her in AM if needed.   1:04 AM Pt admitted to triad for pain management and monitor over night and possible surgical consult in AM.   Filed Vitals:   06/06/14 2050  BP: 115/80  Pulse: 84  Temp: 98.3 F (36.8 C)  TempSrc: Oral  Resp: 20  SpO2: 100%     Tyera Hansley A Lakendra Helling, PA-C 06/07/14 0105

## 2014-06-06 NOTE — ED Notes (Signed)
PA at bedside.

## 2014-06-07 DIAGNOSIS — K469 Unspecified abdominal hernia without obstruction or gangrene: Secondary | ICD-10-CM

## 2014-06-07 DIAGNOSIS — G8929 Other chronic pain: Secondary | ICD-10-CM | POA: Diagnosis present

## 2014-06-07 DIAGNOSIS — I10 Essential (primary) hypertension: Secondary | ICD-10-CM | POA: Diagnosis present

## 2014-06-07 DIAGNOSIS — E038 Other specified hypothyroidism: Secondary | ICD-10-CM

## 2014-06-07 DIAGNOSIS — E039 Hypothyroidism, unspecified: Secondary | ICD-10-CM | POA: Diagnosis present

## 2014-06-07 DIAGNOSIS — R1031 Right lower quadrant pain: Secondary | ICD-10-CM

## 2014-06-07 LAB — BASIC METABOLIC PANEL
Anion gap: 10 (ref 5–15)
BUN: 19 mg/dL (ref 6–23)
CHLORIDE: 100 meq/L (ref 96–112)
CO2: 27 meq/L (ref 19–32)
Calcium: 9.4 mg/dL (ref 8.4–10.5)
Creatinine, Ser: 0.81 mg/dL (ref 0.50–1.10)
GFR calc Af Amer: 74 mL/min — ABNORMAL LOW (ref >90)
GFR calc non Af Amer: 63 mL/min — ABNORMAL LOW (ref >90)
GLUCOSE: 102 mg/dL — AB (ref 70–99)
POTASSIUM: 3.7 meq/L (ref 3.7–5.3)
Sodium: 137 mEq/L (ref 137–147)

## 2014-06-07 LAB — CBC
HEMATOCRIT: 34.3 % — AB (ref 36.0–46.0)
Hemoglobin: 11.5 g/dL — ABNORMAL LOW (ref 12.0–15.0)
MCH: 30.6 pg (ref 26.0–34.0)
MCHC: 33.5 g/dL (ref 30.0–36.0)
MCV: 91.2 fL (ref 78.0–100.0)
Platelets: 193 10*3/uL (ref 150–400)
RBC: 3.76 MIL/uL — AB (ref 3.87–5.11)
RDW: 14.4 % (ref 11.5–15.5)
WBC: 6 10*3/uL (ref 4.0–10.5)

## 2014-06-07 MED ORDER — HYDROCHLOROTHIAZIDE 12.5 MG PO CAPS
12.5000 mg | ORAL_CAPSULE | Freq: Every morning | ORAL | Status: DC
  Administered 2014-06-07 – 2014-06-08 (×2): 12.5 mg via ORAL
  Filled 2014-06-07 (×2): qty 1

## 2014-06-07 MED ORDER — HYDROMORPHONE HCL 1 MG/ML IJ SOLN: 1.0000 mg | INTRAMUSCULAR | Status: DC | PRN

## 2014-06-07 MED ORDER — PRAVASTATIN SODIUM 40 MG PO TABS
40.0000 mg | ORAL_TABLET | Freq: Every day | ORAL | Status: DC
  Administered 2014-06-07 – 2014-06-08 (×2): 40 mg via ORAL
  Filled 2014-06-07 (×3): qty 1

## 2014-06-07 MED ORDER — SODIUM CHLORIDE 0.9 % IV SOLN: INTRAVENOUS | Status: DC

## 2014-06-07 MED ORDER — CALCIUM CARBONATE-VITAMIN D 500-200 MG-UNIT PO TABS
1.0000 | ORAL_TABLET | Freq: Every day | ORAL | Status: DC
  Administered 2014-06-07: 1 via ORAL
  Filled 2014-06-07 (×2): qty 1

## 2014-06-07 MED ORDER — MELOXICAM 15 MG PO TABS
15.0000 mg | ORAL_TABLET | Freq: Every day | ORAL | Status: DC
  Administered 2014-06-07 – 2014-06-09 (×3): 15 mg via ORAL
  Filled 2014-06-07 (×3): qty 1

## 2014-06-07 MED ORDER — ASPIRIN-ACETAMINOPHEN-CAFFEINE 250-250-65 MG PO TABS
1.0000 | ORAL_TABLET | Freq: Four times a day (QID) | ORAL | Status: DC | PRN
  Filled 2014-06-07 (×2): qty 1

## 2014-06-07 MED ORDER — CALCIUM + D3 600-200 MG-UNIT PO TABS: 1.0000 | ORAL_TABLET | Freq: Every day | ORAL | Status: DC

## 2014-06-07 MED ORDER — ONDANSETRON HCL 4 MG/2ML IJ SOLN: 4.0000 mg | Freq: Four times a day (QID) | INTRAMUSCULAR | Status: DC | PRN

## 2014-06-07 MED ORDER — GABAPENTIN 100 MG PO CAPS
100.0000 mg | ORAL_CAPSULE | Freq: Three times a day (TID) | ORAL | Status: DC
  Administered 2014-06-07 – 2014-06-09 (×7): 100 mg via ORAL
  Filled 2014-06-07 (×9): qty 1

## 2014-06-07 MED ORDER — ADULT MULTIVITAMIN W/MINERALS CH
1.0000 | ORAL_TABLET | Freq: Every day | ORAL | Status: DC
  Administered 2014-06-07 – 2014-06-08 (×2): 1 via ORAL
  Filled 2014-06-07 (×2): qty 1

## 2014-06-07 MED ORDER — VITAMIN C 500 MG PO TABS
500.0000 mg | ORAL_TABLET | Freq: Every day | ORAL | Status: DC
  Administered 2014-06-07 – 2014-06-08 (×2): 500 mg via ORAL
  Filled 2014-06-07 (×2): qty 1

## 2014-06-07 MED ORDER — POLYETHYLENE GLYCOL 3350 17 G PO PACK
17.0000 g | PACK | Freq: Two times a day (BID) | ORAL | Status: DC | PRN
  Filled 2014-06-07: qty 1

## 2014-06-07 MED ORDER — MAGNESIUM GLUCONATE 500 MG PO TABS
250.0000 mg | ORAL_TABLET | Freq: Every day | ORAL | Status: DC
  Administered 2014-06-07 – 2014-06-08 (×2): 250 mg via ORAL
  Filled 2014-06-07 (×3): qty 1

## 2014-06-07 MED ORDER — INFLUENZA VAC SPLIT QUAD 0.5 ML IM SUSY
0.5000 mL | PREFILLED_SYRINGE | INTRAMUSCULAR | Status: DC
  Filled 2014-06-07 (×3): qty 0.5

## 2014-06-07 MED ORDER — SODIUM CHLORIDE 0.9 % IV SOLN
INTRAVENOUS | Status: DC
  Administered 2014-06-07: 03:00:00 via INTRAVENOUS
  Administered 2014-06-07 (×2): 50 mL/h via INTRAVENOUS

## 2014-06-07 MED ORDER — ONDANSETRON HCL 4 MG PO TABS: 4.0000 mg | ORAL_TABLET | Freq: Four times a day (QID) | ORAL | Status: DC | PRN

## 2014-06-07 MED ORDER — DOCUSATE SODIUM 100 MG PO CAPS
100.0000 mg | ORAL_CAPSULE | Freq: Two times a day (BID) | ORAL | Status: DC
  Administered 2014-06-07 – 2014-06-09 (×5): 100 mg via ORAL
  Filled 2014-06-07 (×7): qty 1

## 2014-06-07 MED ORDER — BRINZOLAMIDE 1 % OP SUSP
1.0000 [drp] | Freq: Two times a day (BID) | OPHTHALMIC | Status: DC
  Administered 2014-06-07 – 2014-06-09 (×5): 1 [drp] via OPHTHALMIC
  Filled 2014-06-07: qty 10

## 2014-06-07 MED ORDER — VITAMIN B-12 1000 MCG PO TABS
1000.0000 ug | ORAL_TABLET | Freq: Every day | ORAL | Status: DC
  Administered 2014-06-07 – 2014-06-08 (×2): 1000 ug via ORAL
  Filled 2014-06-07 (×2): qty 1

## 2014-06-07 MED ORDER — LEVOTHYROXINE SODIUM 75 MCG PO TABS
75.0000 ug | ORAL_TABLET | Freq: Every day | ORAL | Status: DC
  Administered 2014-06-07 – 2014-06-09 (×3): 75 ug via ORAL
  Filled 2014-06-07 (×4): qty 1

## 2014-06-07 MED ORDER — HYDROCODONE-ACETAMINOPHEN 7.5-325 MG PO TABS
1.0000 | ORAL_TABLET | ORAL | Status: DC | PRN
  Administered 2014-06-07 (×2): 1 via ORAL
  Filled 2014-06-07 (×2): qty 1

## 2014-06-07 MED ORDER — MAGNESIUM 250 MG PO TABS: 1.0000 | ORAL_TABLET | Freq: Every day | ORAL | Status: DC

## 2014-06-07 MED ORDER — ONDANSETRON HCL 4 MG/2ML IJ SOLN: 4.0000 mg | Freq: Three times a day (TID) | INTRAMUSCULAR | Status: DC | PRN

## 2014-06-07 MED ORDER — HYDROCODONE-ACETAMINOPHEN 5-325 MG PO TABS
1.0000 | ORAL_TABLET | Freq: Four times a day (QID) | ORAL | Status: DC | PRN
  Administered 2014-06-07 – 2014-06-09 (×5): 1 via ORAL
  Filled 2014-06-07 (×5): qty 1

## 2014-06-07 NOTE — Progress Notes (Signed)
UR completed 

## 2014-06-07 NOTE — Consult Note (Signed)
Reason for Consult:abdominal pain Referring Physician: Dr. Jaye Beagle is an 80 y.o. female.  HPI: The pt is an 80yo wf who lives in [place]. She has had a hernia on her right abdomen laterally. Yesterday while shopping it bulged out and became more tender. She denies any nausea or vomiting. She denies fever or chills. She drove here from [place] to get an opinion. The hernia was reduced by ER doc  Past Medical History  Diagnosis Date  . Spinal stenosis   . Spondylosis of lumbosacral region   . Scoliosis   . Shoulder pain   . Osteoarthritis     both knees  . Knee pain     bilateral  . Heart murmur   . Hypothyroidism   . Chronic pain   . Polyneuropathy in other diseases classified elsewhere 01/02/2014  . Difficulty swallowing     in the morning  . Glaucoma   . Hypertension     "not high- on medication for precaution"  . GERD (gastroesophageal reflux disease)     occasional  . H/O rotator cuff tear 10 years ago    right  . Equilibrium disorder   . Double vision     right eye    Past Surgical History  Procedure Laterality Date  . Oophorectomy    . Cataract extraction      bilateral  . Dilation and curettage of uterus    . Esophageal dilation  ~2012  . Inguinal hernia repair Bilateral   . Shoulder surgery Left     "rebuilt"  . Total abdominal hysterectomy  1991    with bladder tac  . Total knee arthroplasty Right 01/21/2014    Procedure: RIGHT TOTAL KNEE ARTHROPLASTY;  Surgeon: Mauri Pole, MD;  Location: [facility];  Service: Orthopedics;  Laterality: Right;    Family History  Problem Relation Age of Onset  . CVA Mother   . CVA Father   . Cancer Brother     Social History:  reports that she has never smoked. She has never used smokeless tobacco. She reports that she does not drink alcohol or use illicit drugs.  Allergies: No Known Allergies  Medications: I have reviewed the patient's current medications.  Results for orders placed during the  hospital encounter of 06/06/14 (from the past 48 hour(s))  CBC WITH DIFFERENTIAL     Status: None   Collection Time    06/06/14 10:07 PM      Result Value Ref Range   WBC 8.1  4.0 - 10.5 K/uL   RBC 4.04  3.87 - 5.11 MIL/uL   Hemoglobin 12.1  12.0 - 15.0 g/dL   HCT 36.8  36.0 - 46.0 %   MCV 91.1  78.0 - 100.0 fL   MCH 30.0  26.0 - 34.0 pg   MCHC 32.9  30.0 - 36.0 g/dL   RDW 14.3  11.5 - 15.5 %   Platelets 237  150 - 400 K/uL   Neutrophils Relative % 54  43 - 77 %   Neutro Abs 4.4  1.7 - 7.7 K/uL   Lymphocytes Relative 30  12 - 46 %   Lymphs Abs 2.4  0.7 - 4.0 K/uL   Monocytes Relative 12  3 - 12 %   Monocytes Absolute 1.0  0.1 - 1.0 K/uL   Eosinophils Relative 4  0 - 5 %   Eosinophils Absolute 0.3  0.0 - 0.7 K/uL   Basophils Relative 0  0 - 1 %  Basophils Absolute 0.0  0.0 - 0.1 K/uL  COMPREHENSIVE METABOLIC PANEL     Status: Abnormal   Collection Time    06/06/14 10:07 PM      Result Value Ref Range   Sodium 139  137 - 147 mEq/L   Potassium 4.1  3.7 - 5.3 mEq/L   Chloride 102  96 - 112 mEq/L   CO2 25  19 - 32 mEq/L   Glucose, Bld 108 (*) 70 - 99 mg/dL   BUN 23  6 - 23 mg/dL   Creatinine, Ser 0.79  0.50 - 1.10 mg/dL   Calcium 10.0  8.4 - 10.5 mg/dL   Total Protein 6.7  6.0 - 8.3 g/dL   Albumin 3.7  3.5 - 5.2 g/dL   AST 22  0 - 37 U/L   ALT 16  0 - 35 U/L   Alkaline Phosphatase 46  39 - 117 U/L   Total Bilirubin 0.2 (*) 0.3 - 1.2 mg/dL   GFR calc non Af Amer 73 (*) >90 mL/min   GFR calc Af Amer 84 (*) >90 mL/min   Comment: (NOTE)     The eGFR has been calculated using the CKD EPI equation.     This calculation has not been validated in all clinical situations.     eGFR's persistently <90 mL/min signify possible Chronic Kidney     Disease.   Anion gap 12  5 - 15  LIPASE, BLOOD     Status: None   Collection Time    06/06/14 10:07 PM      Result Value Ref Range   Lipase 21  11 - 59 U/L  BASIC METABOLIC PANEL     Status: Abnormal   Collection Time    06/07/14  5:00  AM      Result Value Ref Range   Sodium 137  137 - 147 mEq/L   Potassium 3.7  3.7 - 5.3 mEq/L   Chloride 100  96 - 112 mEq/L   CO2 27  19 - 32 mEq/L   Glucose, Bld 102 (*) 70 - 99 mg/dL   BUN 19  6 - 23 mg/dL   Creatinine, Ser 0.81  0.50 - 1.10 mg/dL   Calcium 9.4  8.4 - 10.5 mg/dL   GFR calc non Af Amer 63 (*) >90 mL/min   GFR calc Af Amer 74 (*) >90 mL/min   Comment: (NOTE)     The eGFR has been calculated using the CKD EPI equation.     This calculation has not been validated in all clinical situations.     eGFR's persistently <90 mL/min signify possible Chronic Kidney     Disease.   Anion gap 10  5 - 15  CBC     Status: Abnormal   Collection Time    06/07/14  5:00 AM      Result Value Ref Range   WBC 6.0  4.0 - 10.5 K/uL   RBC 3.76 (*) 3.87 - 5.11 MIL/uL   Hemoglobin 11.5 (*) 12.0 - 15.0 g/dL   HCT 34.3 (*) 36.0 - 46.0 %   MCV 91.2  78.0 - 100.0 fL   MCH 30.6  26.0 - 34.0 pg   MCHC 33.5  30.0 - 36.0 g/dL   RDW 14.4  11.5 - 15.5 %   Platelets 193  150 - 400 K/uL    Ct Abdomen Pelvis W Contrast  06/07/2014   ADDENDUM REPORT: 06/07/2014 00:22  ADDENDUM: Corrected Findings and Impression:  FINDINGS: Mild dependent atelectasis in the lung bases. Aortic and mitral valve calcification. Small esophageal hiatal hernia. Residual contrast material in the lower esophagus suggesting reflux or dysmotility. Lower esophageal wall thickening suggesting reflux disease.  The liver, spleen, gallbladder, pancreas, adrenal glands, inferior vena cava, and retroperitoneal lymph nodes are unremarkable. Small parenchymal cysts in the kidneys. No solid mass or hydronephrosis. Calcification of the abdominal aorta without aneurysm. Stomach and small bowel appear normal. Stool-filled colon without distention or wall thickening. There is a right flank SPIGELIAN TYPE anterior abdominal wall hernia containing a portion of the ascending colon. No evidence of proximal obstruction.  Pelvis: Bladder wall is not  thickened. Uterus appears surgically absent. No pelvic mass or lymphadenopathy. No free or loculated pelvic fluid collections. No evidence of diverticulitis. Degenerative changes and diffuse demineralization throughout the spine. Spondylolysis at L4-5 with a mild spondylolisthesis.  IMPRESSION: Right lower quadrant abdominal wall SPIGELIAN type hernia containing the ascending colon without evidence of obstruction. Small esophageal hiatal hernia with changes likely due to reflux in the lower esophagus.   Electronically Signed   By: Lucienne Capers M.D.   On: 06/07/2014 00:22   06/07/2014   CLINICAL DATA:  Right lower quadrant hernia pain tonight.  EXAM: CT ABDOMEN AND PELVIS WITH CONTRAST  TECHNIQUE: Multidetector CT imaging of the abdomen and pelvis was performed using the standard protocol following bolus administration of intravenous contrast.  CONTRAST:  136m OMNIPAQUE IOHEXOL 300 MG/ML  SOLN  COMPARISON:  02/22/2009  FINDINGS: Mild dependent atelectasis in the lung bases. Aortic and mitral valve calcification. Small esophageal hiatal hernia. Residual contrast material in the lower esophagus suggesting reflux or dysmotility. Lower esophageal wall thickening suggesting reflux disease.  The liver, spleen, gallbladder, pancreas, adrenal glands, inferior vena cava, and retroperitoneal lymph nodes are unremarkable. Small parenchymal cysts in the kidneys. No solid mass or hydronephrosis. Calcification of the abdominal aorta without aneurysm. Stomach and small bowel appear normal. Stool-filled colon without distention or wall thickening. There is a right flank speed daily in tight anterior abdominal wall hernia containing a portion of the ascending colon. No evidence of proximal obstruction.  Pelvis: Bladder wall is not thickened. Uterus appears surgically absent. No pelvic mass or lymphadenopathy. No free or loculated pelvic fluid collections. No evidence of diverticulitis. Degenerative changes and diffuse  demineralization throughout the spine. Spondylolysis at L4-5 with a mild spondylolisthesis.  IMPRESSION: Right lower quadrant abdominal walls be daily in type hernia containing the ascending colon without evidence of obstruction. Small esophageal hiatal hernia with changes likely due to reflux in the lower esophagus.  Electronically Signed: By: WLucienne CapersM.D. On: 06/06/2014 23:58    Review of Systems  Constitutional: Negative.   HENT: Negative.   Eyes: Negative.   Respiratory: Negative.   Cardiovascular: Negative.   Gastrointestinal: Positive for abdominal pain. Negative for nausea and vomiting.  Genitourinary: Negative.   Musculoskeletal: Negative.   Skin: Negative.   Neurological: Negative.   Endo/Heme/Allergies: Negative.   Psychiatric/Behavioral: Positive for memory loss.   Blood pressure 116/44, pulse 73, temperature 98.6 F (37 C), temperature source Oral, resp. rate 16, height _0  (1.6 m), weight 152 lb 12.5 oz (69.3 kg), SpO2 96.00%. Physical Exam  Constitutional: She is oriented to person, place, and time. She appears well-developed and well-nourished.  HENT:  Head: Normocephalic and atraumatic.  Eyes: Conjunctivae and EOM are normal. Pupils are equal, round, and reactive to light.  Neck: Normal range of motion. Neck supple.  Cardiovascular: Normal rate, regular rhythm  and normal heart sounds.   Respiratory: Effort normal and breath sounds normal.  GI: Soft. Bowel sounds are normal.  Mild tenderness on right. No guarding or peritonitis. No sign of obstruction  Musculoskeletal: Normal range of motion.  Neurological: She is alert and oriented to person, place, and time.  Skin: Skin is warm and dry.  Psychiatric: She has a normal mood and affect. Her behavior is normal.    Assessment/Plan: The pt may have a small abdominal wall hernia on right laterally. Her abdomen is soft and there is no sign of obstruction. At 80yo there would be some risk to having surgery.  There is no indication for urgent surgery at this point. She will need medical clearance and can follow up with [place] as an outpt.  TOTH III,Keller Bounds S 06/07/2014, 11:18 AM

## 2014-06-07 NOTE — Progress Notes (Signed)
I have seen and assessed patient and agree with Dr. Shanon Brow assessment and plan. Patient presenting with an ventral abdominal hernia which was reduced in the emergency room. Patient currently tolerating a regular diet. Patient has been seen by general surgery and feel no indication for urgent surgery at this time.

## 2014-06-07 NOTE — H&P (Signed)
PCP:   [name],[name], MD   Chief Complaint:  abd pain  HPI: 80 yo female with rlq abd pain started today, she has known hernia there and it occasionally gives her trouble.  She has been evaluated at [place] for this, but it sounds like the watch and wait approach has been taken.  She says today is the worse it has ever been.  She is very frustrated and aggravated b/c she is tired of dealing with this pain.  Her children drove her an hour and an half here b/c she did not want to go to the hospital in [place].  She denies any n/v.  No diarrhea.  No chest pain.  She was sedated, the hernia was reduced in the ED and she is feeling much better now but is very tired.  We are asked to obs her for surgical evaluation in the am.  Review of Systems:  Positive and negative as per HPI otherwise all other systems are negative  Past Medical History: Past Medical History  Diagnosis Date  . Spinal stenosis   . Spondylosis of lumbosacral region   . Scoliosis   . Shoulder pain   . Osteoarthritis     both knees  . Knee pain     bilateral  . Heart murmur   . Hypothyroidism   . Chronic pain   . Polyneuropathy in other diseases classified elsewhere 01/02/2014  . Difficulty swallowing     in the morning  . Glaucoma   . Hypertension     "not high- on medication for precaution"  . GERD (gastroesophageal reflux disease)     occasional  . H/O rotator cuff tear 10 years ago    right  . Equilibrium disorder   . Double vision     right eye   Past Surgical History  Procedure Laterality Date  . Oophorectomy    . Cataract extraction      bilateral  . Dilation and curettage of uterus    . Esophageal dilation  ~2012  . Inguinal hernia repair Bilateral   . Shoulder surgery Left     "rebuilt"  . Total abdominal hysterectomy  1991    with bladder tac  . Total knee arthroplasty Right 01/21/2014    Procedure: RIGHT TOTAL KNEE ARTHROPLASTY;  Surgeon: Mauri Pole, MD;  Location: [facility];  Service:  Orthopedics;  Laterality: Right;    Medications: Prior to Admission medications   Medication Sig Start Date End Date Taking? Authorizing Provider  aspirin-acetaminophen-caffeine (EXCEDRIN EXTRA STRENGTH) [id number] MG per tablet Take 1 tablet by mouth every 6 (six) hours as needed for headache (pain).   Yes Historical Provider, MD  brinzolamide (AZOPT) 1 % ophthalmic suspension Place 1 drop into both eyes 2 (two) times daily.    Yes Historical Provider, MD  Calcium Carb-Cholecalciferol (CALCIUM + D3) 600-200 MG-UNIT TABS Take 1 tablet by mouth daily.   Yes Historical Provider, MD  docusate sodium 100 MG CAPS Take 100 mg by mouth 2 (two) times daily. 01/23/14  Yes Lucille Passy Babish, PA-C  gabapentin (NEURONTIN) 100 MG capsule Take 1 capsule (100 mg total) by mouth 3 (three) times daily. 01/02/14  Yes Kathrynn Ducking, MD  Garlic 540 MG TABS Take 100 mg by mouth daily.    Yes Historical Provider, MD  hydrochlorothiazide (HYDRODIURIL) 25 MG tablet Take 12.5 mg by mouth every morning.    Yes Historical Provider, MD  HYDROcodone-acetaminophen (NORCO) 7.5-325 MG per tablet Take 1 tablet by mouth every  4 (four) hours as needed for moderate pain (hip pain).   Yes Historical Provider, MD  levothyroxine (SYNTHROID, LEVOTHROID) 75 MCG tablet Take 75 mcg by mouth daily before breakfast.   Yes Historical Provider, MD  Magnesium 250 MG TABS Take 1 tablet by mouth daily.   Yes Historical Provider, MD  moexipril (UNIVASC) 7.5 MG tablet Take 7.5 mg by mouth daily.    Yes Historical Provider, MD  Multiple Vitamin (MULTIVITAMIN WITH MINERALS) TABS tablet Take 1 tablet by mouth daily.   Yes Historical Provider, MD  Omega-3 Fatty Acids (FISH OIL) 1000 MG CAPS Take 1,000 mg by mouth daily.   Yes Historical Provider, MD  omeprazole (PRILOSEC) 20 MG capsule Take 20 mg by mouth every morning.    Yes Historical Provider, MD  polyethylene glycol (MIRALAX / GLYCOLAX) packet Take 17 g by mouth 2 (two) times daily as needed  for mild constipation (constipation).   Yes Historical Provider, MD  pravastatin (PRAVACHOL) 40 MG tablet Take 40 mg by mouth at bedtime.    Yes Historical Provider, MD  vitamin B-12 (CYANOCOBALAMIN) 1000 MCG tablet Take 1,000 mcg by mouth daily.   Yes Historical Provider, MD  vitamin C (ASCORBIC ACID) 500 MG tablet Take 500 mg by mouth daily.   Yes Historical Provider, MD    Allergies:  No Known Allergies  Social History:  reports that she has never smoked. She has never used smokeless tobacco. She reports that she does not drink alcohol or use illicit drugs.  Family History: Family History  Problem Relation Age of Onset  . CVA Mother   . CVA Father   . Cancer Brother     Physical Exam: Filed Vitals:   06/06/14 2050  BP: 115/80  Pulse: 84  Temp: 98.3 F (36.8 C)  TempSrc: Oral  Resp: 20  SpO2: 100%   General appearance: alert, cooperative and no distress Head: Normocephalic, without obvious abnormality, atraumatic Eyes: negative Nose: Nares normal. Septum midline. Mucosa normal. No drainage or sinus tenderness. Neck: no JVD and supple, symmetrical, trachea midline Lungs: clear to auscultation bilaterally Heart: regular rate and rhythm, S1, S2 normal, no murmur, click, rub or gallop Abdomen: soft, non-tender; bowel sounds normal; no masses,  no organomegaly Extremities: extremities normal, atraumatic, no cyanosis or edema Pulses: 2+ and symmetric Skin: Skin color, texture, turgor normal. No rashes or lesions Neurologic: Grossly normal   Labs on Admission:   Recent Labs  06/06/14 2207  NA 139  K 4.1  CL 102  CO2 25  GLUCOSE 108*  BUN 23  CREATININE 0.79  CALCIUM 10.0    Recent Labs  06/06/14 2207  AST 22  ALT 16  ALKPHOS 46  BILITOT 0.2*  PROT 6.7  ALBUMIN 3.7    Recent Labs  06/06/14 2207  LIPASE 21    Recent Labs  06/06/14 2207  WBC 8.1  NEUTROABS 4.4  HGB 12.1  HCT 36.8  MCV 91.1  PLT 237   Radiological Exams on Admission: Ct  Abdomen Pelvis W Contrast  06/07/2014   ADDENDUM REPORT: 06/07/2014 00:22  ADDENDUM: Corrected Findings and Impression:  FINDINGS: Mild dependent atelectasis in the lung bases. Aortic and mitral valve calcification. Small esophageal hiatal hernia. Residual contrast material in the lower esophagus suggesting reflux or dysmotility. Lower esophageal wall thickening suggesting reflux disease.  The liver, spleen, gallbladder, pancreas, adrenal glands, inferior vena cava, and retroperitoneal lymph nodes are unremarkable. Small parenchymal cysts in the kidneys. No solid mass or hydronephrosis. Calcification of the  abdominal aorta without aneurysm. Stomach and small bowel appear normal. Stool-filled colon without distention or wall thickening. There is a right flank SPIGELIAN TYPE anterior abdominal wall hernia containing a portion of the ascending colon. No evidence of proximal obstruction.  Pelvis: Bladder wall is not thickened. Uterus appears surgically absent. No pelvic mass or lymphadenopathy. No free or loculated pelvic fluid collections. No evidence of diverticulitis. Degenerative changes and diffuse demineralization throughout the spine. Spondylolysis at L4-5 with a mild spondylolisthesis.  IMPRESSION: Right lower quadrant abdominal wall SPIGELIAN type hernia containing the ascending colon without evidence of obstruction. Small esophageal hiatal hernia with changes likely due to reflux in the lower esophagus.   Electronically Signed   By: Lucienne Capers M.D.   On: 06/07/2014 00:22   06/07/2014   CLINICAL DATA:  Right lower quadrant hernia pain tonight.  EXAM: CT ABDOMEN AND PELVIS WITH CONTRAST  TECHNIQUE: Multidetector CT imaging of the abdomen and pelvis was performed using the standard protocol following bolus administration of intravenous contrast.  CONTRAST:  175mL OMNIPAQUE IOHEXOL 300 MG/ML  SOLN  COMPARISON:  02/22/2009  FINDINGS: Mild dependent atelectasis in the lung bases. Aortic and mitral valve  calcification. Small esophageal hiatal hernia. Residual contrast material in the lower esophagus suggesting reflux or dysmotility. Lower esophageal wall thickening suggesting reflux disease.  The liver, spleen, gallbladder, pancreas, adrenal glands, inferior vena cava, and retroperitoneal lymph nodes are unremarkable. Small parenchymal cysts in the kidneys. No solid mass or hydronephrosis. Calcification of the abdominal aorta without aneurysm. Stomach and small bowel appear normal. Stool-filled colon without distention or wall thickening. There is a right flank speed daily in tight anterior abdominal wall hernia containing a portion of the ascending colon. No evidence of proximal obstruction.  Pelvis: Bladder wall is not thickened. Uterus appears surgically absent. No pelvic mass or lymphadenopathy. No free or loculated pelvic fluid collections. No evidence of diverticulitis. Degenerative changes and diffuse demineralization throughout the spine. Spondylolysis at L4-5 with a mild spondylolisthesis.  IMPRESSION: Right lower quadrant abdominal walls be daily in type hernia containing the ascending colon without evidence of obstruction. Small esophageal hiatal hernia with changes likely due to reflux in the lower esophagus.  Electronically Signed: By: Lucienne Capers M.D. On: 06/06/2014 23:58    Assessment/Plan  80 yo female with acute rlq abd pain with reducible hernia  Principal Problem:   Abdominal hernia-  Pain resolved now.  abd exam is benign.  General surgery has been called, request medical observation overnight until they can see in am.  Keep npo for now, and place on ivf overnight.  Provide dilaudid and zofran prn.  Active Problems:  Stable unless o/w noted   Chronic pain   Hypertension   Hypothyroidism   Abdominal pain, RLQ  Full code.  obs on med surgical.  DAVID,RACHAL A 06/07/2014, 12:58 AM

## 2014-06-08 DIAGNOSIS — K469 Unspecified abdominal hernia without obstruction or gangrene: Secondary | ICD-10-CM | POA: Diagnosis not present

## 2014-06-08 DIAGNOSIS — E039 Hypothyroidism, unspecified: Secondary | ICD-10-CM

## 2014-06-08 LAB — BASIC METABOLIC PANEL
ANION GAP: 11 (ref 5–15)
BUN: 17 mg/dL (ref 6–23)
CHLORIDE: 101 meq/L (ref 96–112)
CO2: 27 meq/L (ref 19–32)
Calcium: 9.5 mg/dL (ref 8.4–10.5)
Creatinine, Ser: 0.79 mg/dL (ref 0.50–1.10)
GFR calc Af Amer: 84 mL/min — ABNORMAL LOW (ref >90)
GFR calc non Af Amer: 73 mL/min — ABNORMAL LOW (ref >90)
GLUCOSE: 106 mg/dL — AB (ref 70–99)
Potassium: 4 mEq/L (ref 3.7–5.3)
Sodium: 139 mEq/L (ref 137–147)

## 2014-06-08 LAB — CBC
HCT: 36.6 % (ref 36.0–46.0)
HEMOGLOBIN: 11.9 g/dL — AB (ref 12.0–15.0)
MCH: 29.8 pg (ref 26.0–34.0)
MCHC: 32.5 g/dL (ref 30.0–36.0)
MCV: 91.5 fL (ref 78.0–100.0)
Platelets: 221 10*3/uL (ref 150–400)
RBC: 4 MIL/uL (ref 3.87–5.11)
RDW: 14.5 % (ref 11.5–15.5)
WBC: 4.7 10*3/uL (ref 4.0–10.5)

## 2014-06-08 MED ORDER — ENOXAPARIN SODIUM 30 MG/0.3ML ~~LOC~~ SOLN
30.0000 mg | SUBCUTANEOUS | Status: DC
Start: 2014-06-08 — End: 2014-06-09
  Filled 2014-06-08 (×2): qty 0.3

## 2014-06-08 NOTE — ED Provider Notes (Signed)
Medical screening examination/treatment/procedure(s) were conducted as a shared visit with non-physician practitioner(s) and myself.  I personally evaluated the patient during the encounter.   EKG Interpretation None       Patient with hernia that is reduced by PA. Pain improved. CT obtained, will dispo based on results.  Ephraim Hamburger, MD 06/08/14 [phone]

## 2014-06-08 NOTE — Progress Notes (Signed)
PT Cancellation Note  Patient Details Name: [name] MRN: [id number] DOB: 17-Nov-1924   Cancelled Treatment:    Reason Eval/Treat Not Completed: PT screened, no needs identified, will sign off. Spoke briefly with pt who denies need for PT and declines therapy services. Will sign off. Thanks.    Weston Anna, MPT Pager: [phone]

## 2014-06-08 NOTE — Progress Notes (Signed)
UR completed 

## 2014-06-08 NOTE — Progress Notes (Signed)
OT Cancellation Note  Patient Details Name: JERZI TIGERT MRN: [id number] DOB: 12/31/1924   Cancelled Treatment:    Reason Eval/Treat Not Completed: Other (comment). Checked in with pt and she was feeling like pill was stuck and nauseas.  She does not think she needs OT.  Per last notes, she is home alone.  Will check with PT/nursing tomorrow to make sure she is moving OK and able to complete ADLs.  We will follow up tomorrow and either sign off or reattempt evaluation depending on how she is doing.    Jovaun Levene 06/08/2014, 10:55 AM Lesle Chris, OTR/L [phone] 06/08/2014

## 2014-06-08 NOTE — Progress Notes (Signed)
[facility] PROGRESS NOTE  FERRELL FLAM OEH:[id number] DOB: April 01, 1925 DOA: 06/06/2014 PCP: [name], MD  Assessment/Plan:  #1 abdominal wall hernia on the right  Abdomen is soft no signs of obstruction. Patient currently tolerating diet. Abdominal wall hernia was reduced by  ED physician. General surgery following. Follow.    #2  Hypertension  stable.   #3 hypothyroidism  TSH was 0.773 on 01/02/2014. Continue home dose Synthroid.   #4 hyperlipidemia Continue Pravachol.   #5 prophylaxis Lovenox for DVT prophylaxis.   Code Status:  full Family Communication:  Abdomen the patient no family at bedside. Disposition Plan:  Home when medically stable and per general surgery.   Consultants:    General surgeon: Dr. [name] 06/07/2014  Procedures:   CT abdomen and pelvis 06/06/2014  Antibiotics:   none  HPI/Subjective:  patient sitting at bedside complaining that she had to many pills to swallow this morning. Patient has a feeling pill may be stuck in the esophagus.  Objective: Filed Vitals:   06/08/14 0600  BP: 110/60  Pulse: 80  Temp: 98.8 F (37.1 C)  Resp: 18    Intake/Output Summary (Last 24 hours) at 06/08/14 1045 Last data filed at 06/08/14 0600  Gross per 24 hour  Intake 1599.17 ml  Output    850 ml  Net 749.17 ml   Filed Weights   06/07/14 0200 06/07/14 0500 06/08/14 0600  Weight: 69.4 kg (153 lb) 69.3 kg (152 lb 12.5 oz) 69.4 kg (153 lb)    Exam:   General:  nad  Cardiovascular: rrr  Respiratory: ctab  Abdomen:  Soft, nontender, nondistended, positive bowel sounds.  Musculoskeletal:  No clubbing cyanosis or edema.  Data Reviewed: Basic Metabolic Panel:  Recent Labs Lab 06/06/14 2207 06/07/14 0500 06/08/14 0619  NA 139 137 139  K 4.1 3.7 4.0  CL 102 100 101  CO2 25 27 27   GLUCOSE 108* 102* 106*  BUN 23 19 17   CREATININE 0.79 0.81 0.79  CALCIUM 10.0 9.4 9.5   Liver Function Tests:  Recent Labs Lab 06/06/14 2207   AST 22  ALT 16  ALKPHOS 46  BILITOT 0.2*  PROT 6.7  ALBUMIN 3.7    Recent Labs Lab 06/06/14 2207  LIPASE 21   No results found for this basename: AMMONIA,  in the last 168 hours CBC:  Recent Labs Lab 06/06/14 2207 06/07/14 0500 06/08/14 0619  WBC 8.1 6.0 4.7  NEUTROABS 4.4  --   --   HGB 12.1 11.5* 11.9*  HCT 36.8 34.3* 36.6  MCV 91.1 91.2 91.5  PLT 237 193 221   Cardiac Enzymes: No results found for this basename: CKTOTAL, CKMB, CKMBINDEX, TROPONINI,  in the last 168 hours BNP (last 3 results) No results found for this basename: PROBNP,  in the last 8760 hours CBG: No results found for this basename: GLUCAP,  in the last 168 hours  No results found for this or any previous visit (from the past 240 hour(s)).   Studies: Ct Abdomen Pelvis W Contrast  06/07/2014   ADDENDUM REPORT: 06/07/2014 00:22  ADDENDUM: Corrected Findings and Impression:  FINDINGS: Mild dependent atelectasis in the lung bases. Aortic and mitral valve calcification. Small esophageal hiatal hernia. Residual contrast material in the lower esophagus suggesting reflux or dysmotility. Lower esophageal wall thickening suggesting reflux disease.  The liver, spleen, gallbladder, pancreas, adrenal glands, inferior vena cava, and retroperitoneal lymph nodes are unremarkable. Small parenchymal cysts in the kidneys. No solid mass or hydronephrosis. Calcification of the  abdominal aorta without aneurysm. Stomach and small bowel appear normal. Stool-filled colon without distention or wall thickening. There is a right flank SPIGELIAN TYPE anterior abdominal wall hernia containing a portion of the ascending colon. No evidence of proximal obstruction.  Pelvis: Bladder wall is not thickened. Uterus appears surgically absent. No pelvic mass or lymphadenopathy. No free or loculated pelvic fluid collections. No evidence of diverticulitis. Degenerative changes and diffuse demineralization throughout the spine. Spondylolysis at  L4-5 with a mild spondylolisthesis.  IMPRESSION: Right lower quadrant abdominal wall SPIGELIAN type hernia containing the ascending colon without evidence of obstruction. Small esophageal hiatal hernia with changes likely due to reflux in the lower esophagus.   Electronically Signed   By: Lucienne Capers M.D.   On: 06/07/2014 00:22   06/07/2014   CLINICAL DATA:  Right lower quadrant hernia pain tonight.  EXAM: CT ABDOMEN AND PELVIS WITH CONTRAST  TECHNIQUE: Multidetector CT imaging of the abdomen and pelvis was performed using the standard protocol following bolus administration of intravenous contrast.  CONTRAST:  151mL OMNIPAQUE IOHEXOL 300 MG/ML  SOLN  COMPARISON:  02/22/2009  FINDINGS: Mild dependent atelectasis in the lung bases. Aortic and mitral valve calcification. Small esophageal hiatal hernia. Residual contrast material in the lower esophagus suggesting reflux or dysmotility. Lower esophageal wall thickening suggesting reflux disease.  The liver, spleen, gallbladder, pancreas, adrenal glands, inferior vena cava, and retroperitoneal lymph nodes are unremarkable. Small parenchymal cysts in the kidneys. No solid mass or hydronephrosis. Calcification of the abdominal aorta without aneurysm. Stomach and small bowel appear normal. Stool-filled colon without distention or wall thickening. There is a right flank speed daily in tight anterior abdominal wall hernia containing a portion of the ascending colon. No evidence of proximal obstruction.  Pelvis: Bladder wall is not thickened. Uterus appears surgically absent. No pelvic mass or lymphadenopathy. No free or loculated pelvic fluid collections. No evidence of diverticulitis. Degenerative changes and diffuse demineralization throughout the spine. Spondylolysis at L4-5 with a mild spondylolisthesis.  IMPRESSION: Right lower quadrant abdominal walls be daily in type hernia containing the ascending colon without evidence of obstruction. Small esophageal hiatal  hernia with changes likely due to reflux in the lower esophagus.  Electronically Signed: By: Lucienne Capers M.D. On: 06/06/2014 23:58    Scheduled Meds: . brinzolamide  1 drop Both Eyes BID  . docusate sodium  100 mg Oral BID  . gabapentin  100 mg Oral TID  . Influenza vac split quadrivalent PF  0.5 mL Intramuscular Tomorrow-1000  . levothyroxine  75 mcg Oral QAC breakfast  . meloxicam  15 mg Oral Daily  . pravastatin  40 mg Oral QHS   Continuous Infusions:   Principal Problem:   Abdominal hernia Active Problems:   Chronic pain   Hypertension   Hypothyroidism   Abdominal pain, RLQ    Time spent:  30 minutes    THOMPSON,DANIEL  M.D. [facility] Pager [phone].. If 7PM-7AM, please contact night-coverage at [email or web address], password [facility] 06/08/2014, 10:45 AM  LOS: 2 days

## 2014-06-09 DIAGNOSIS — R011 Cardiac murmur, unspecified: Secondary | ICD-10-CM | POA: Diagnosis present

## 2014-06-09 DIAGNOSIS — K469 Unspecified abdominal hernia without obstruction or gangrene: Secondary | ICD-10-CM | POA: Diagnosis not present

## 2014-06-09 LAB — BASIC METABOLIC PANEL
ANION GAP: 12 (ref 5–15)
BUN: 19 mg/dL (ref 6–23)
CHLORIDE: 101 meq/L (ref 96–112)
CO2: 27 mEq/L (ref 19–32)
Calcium: 9.9 mg/dL (ref 8.4–10.5)
Creatinine, Ser: 0.74 mg/dL (ref 0.50–1.10)
GFR calc Af Amer: 86 mL/min — ABNORMAL LOW (ref >90)
GFR calc non Af Amer: 74 mL/min — ABNORMAL LOW (ref >90)
GLUCOSE: 95 mg/dL (ref 70–99)
POTASSIUM: 3.9 meq/L (ref 3.7–5.3)
SODIUM: 140 meq/L (ref 137–147)

## 2014-06-09 NOTE — Discharge Instructions (Signed)
Wear abdominal binder for comfort.    Dr. Zella Richer, [facility], [phone].

## 2014-06-09 NOTE — Progress Notes (Signed)
Patient seen and examined.  She has a loud cardiac murmur.  Before having major abdominal surgery, she needs an outpatient cardiology evaluation for operative risk assessment.  This was explained to her.  If she is felt to be low risk, we would be happy to see her in our office her to discuss elective surgery with her.  Will give her an abdominal binder for comfort.

## 2014-06-09 NOTE — Progress Notes (Signed)
[facility] Progress Note     Subjective: Pt upset she cant have the surgery.  Didn't want to have it done in [place].  Tolerating regular diet.  Pain improving.  Objective: Vital signs in last 24 hours: Temp:  [98 F (36.7 C)-98.4 F (36.9 C)] 98.4 F (36.9 C) (10/26 0852) Pulse Rate:  [69-79] 69 (10/26 0852) Resp:  [16-18] 18 (10/26 0852) BP: (133-147)/(57-68) 147/68 mmHg (10/26 0852) SpO2:  [98 %-100 %] 98 % (10/26 0852) Weight:  [152 lb 6.4 oz (69.128 kg)] 152 lb 6.4 oz (69.128 kg) (10/26 0852) Last BM Date: 06/06/14  Intake/Output from previous day: 10/25 0701 - 10/26 0700 In: 480 [P.O.:480] Out: 1400 [Urine:1400] Intake/Output this shift: Total I/O In: -  Out: 250 [Urine:250]  PE: Gen:  Alert, NAD, pleasant Abd: Soft, mild tenderness on right abdomen over bulge which is easily reducible with some tenderness, +BS, no HSM   Lab Results:   Recent Labs  06/07/14 0500 06/08/14 0619  WBC 6.0 4.7  HGB 11.5* 11.9*  HCT 34.3* 36.6  PLT 193 221   BMET  Recent Labs  06/08/14 0619 06/09/14 0527  NA 139 140  K 4.0 3.9  CL 101 101  CO2 27 27  GLUCOSE 106* 95  BUN 17 19  CREATININE 0.79 0.74  CALCIUM 9.5 9.9   PT/INR No results found for this basename: LABPROT, INR,  in the last 72 hours CMP     Component Value Date/Time   NA 140 06/09/2014 0527   K 3.9 06/09/2014 0527   CL 101 06/09/2014 0527   CO2 27 06/09/2014 0527   GLUCOSE 95 06/09/2014 0527   BUN 19 06/09/2014 0527   CREATININE 0.74 06/09/2014 0527   CALCIUM 9.9 06/09/2014 0527   PROT 6.7 06/06/2014 2207   PROT 6.3 01/02/2014 1558   ALBUMIN 3.7 06/06/2014 2207   AST 22 06/06/2014 2207   ALT 16 06/06/2014 2207   ALKPHOS 46 06/06/2014 2207   BILITOT 0.2* 06/06/2014 2207   GFRNONAA 74* 06/09/2014 0527   GFRAA 86* 06/09/2014 0527   Lipase     Component Value Date/Time   LIPASE 21 06/06/2014 2207       Studies/Results: No results found.  Anti-infectives: Anti-infectives   None       Assessment/Plan Abdominal wall hernia on right lateral - without obstruction or incarceration HTN Hypothyroidism Chronic pain   Plan: 1.  She does not need emergent/urgent surgery.  This procedure is typically done as an outpatient.  She is not obstructed or incarcerated therefore does not warrant surgery at this time.  She should follow up with her established surgeon in [place] when she can be cleared to proceed with surgery by cardiology, or she can follow up with a surgeon here, but this should be done electively. 2.  Tolerating a regular diet 3.  Ambulate and IS 4.  Sounds like she may need HH or SNF, but she refused to have PT/OT see her during admission       LOS: 3 days    [place], [facility] 06/09/2014, 9:38 AM Pager: [phone]

## 2014-06-09 NOTE — Progress Notes (Signed)
OT Cancellation Note  Patient Details Name: [name] MRN: [id number] DOB: 1925-04-02   Cancelled Treatment:    Reason Eval/Treat Not Completed: Other (comment) Pt states she does not have any OT needs and declines therapy. Signing off.  [place], OTR/L  [phone] 06/09/2014 06/09/2014, 8:12 AM

## 2014-06-09 NOTE — Discharge Summary (Signed)
Physician Discharge Summary  [name] MMH:[id number] DOB: 1925/04/02 DOA: 06/06/2014  PCP: [name], MD  Admit date: 06/06/2014 Discharge date: 06/09/2014  Time spent: 65 minutes  Recommendations for Outpatient Follow-up:  1. Follow-up with [name],[name], MD in 1 week.On follow-up patient's abdominal hernia needs to be assessed. Patient will also need referral to cardiologist for further evaluation of her cardiac murmur which she states has been chronic and for preoperative clearance if patient decides on elective surgery for abdominal hernia. 2. Patient may follow-up with her surgeon in [place] for her abdominal hernia  on may follow-up with [facility] surgery as needed.  Discharge Diagnoses:  Principal Problem:   Abdominal hernia Active Problems:   Chronic pain   Hypertension   Hypothyroidism   Abdominal pain, RLQ   Cardiac murmur   Discharge Condition: stable and improved  Diet recommendation: Regular  Filed Weights   06/07/14 0500 06/08/14 0600 06/09/14 0852  Weight: 69.3 kg (152 lb 12.5 oz) 69.4 kg (153 lb) 69.128 kg (152 lb 6.4 oz)    History of present illness:  80 yo female with rlq abd pain which started on the day of admission. Patient has known hernia there and it occasionally gives her trouble. She had been evaluated at [place] for this, but it sounds like the watch and wait approach had been taken. She stated on the day of admission that it was worse than it has ever been. She was very frustrated and aggravated b/c she is tired of dealing with this pain. Her children drove her an hour and an half here b/c she did not want to go to the hospital in [place]. She denied any n/v. No diarrhea. No chest pain. She was sedated, the hernia was reduced in the ED and she  felt much better but was very tired. [facility] hospitalist were asked to obs her for surgical evaluation in the am.      Hospital Course:  #1 abdominal wall hernia on the right  Patient had  presented with right lower quadrant abdominal pain is worse on the day of admission. Patient had presented to the ED with the hernia that was reduced.  CT of the abdomen and pelvis which was done showed a right lower quadrant abdominal wall spigelian type hernia containing ascending colon without evidence of obstruction. Patient was admitted. Patient improved clinically. Abdominal pain improved. General surgical consultation was obtained and patient was seen in consultation by Dr. [name]. It was felt that patient did not need emergent or urgent surgery at this time and a hernia repair could be done as outpatient. Patient was not obstructed or incarcerated in a sinus did not warrant surgery during the hospitalization. It was recommended that patient follow-up with the PCP and her cardiologist for cardiac clearance as patient was noted to have a cardiac murmur that she states has been ongoing for a few years. It was felt patient could follow-up with her surgeon in [place] as outpatient on May follow-up with the surgeons here. Patient was placed on a diet which he tolerated. Patient did not have any further abdominal pain. Abdominal binder was ordered. Patient will be discharged home in stable and improved condition.  #2 Hypertension  stable.  #3 hypothyroidism  TSH was 0.773 on 01/02/2014. Continued on home dose Synthroid.  #4 hyperlipidemia  Continued on home regimen of Pravachol.  #5 cardiac murmur   patient was noted on physical exam to have a cardiac murmur who she states she has known about for the  past 3 years. Patient will need to follow-up with cardiologist as outpatient for further evaluation of her murmur and also for preoperative clearance if patient decides on elective surgery.   Procedures: CT abdomen and pelvis 06/06/2014     Consultations: General surgeon: Dr. [name] 06/07/2014     Discharge Exam: Filed Vitals:   06/09/14 0852  BP: 147/68  Pulse: 69  Temp: 98.4 F (36.9 C)   Resp: 18    General: NAD Cardiovascular: RRR WITH 3/6 sem Respiratory: CTAB  Discharge Instructions You were cared for by a hospitalist during your hospital stay. If you have any questions about your discharge medications or the care you received while you were in the hospital after you are discharged, you can call the unit and asked to speak with the hospitalist on call if the hospitalist that took care of you is not available. Once you are discharged, your primary care physician will handle any further medical issues. Please note that NO REFILLS for any discharge medications will be authorized once you are discharged, as it is imperative that you return to your primary care physician (or establish a relationship with a primary care physician if you do not have one) for your aftercare needs so that they can reassess your need for medications and monitor your lab values.  Discharge Instructions   Diet general    Complete by:  As directed      Discharge instructions    Complete by:  As directed   Follow up with [name],[name], MD in 1 week. Follow uip with cardiology for evaluation of murmur and preop clearance. Follow up with your surgeon.     Increase activity slowly    Complete by:  As directed           Current Discharge Medication List    CONTINUE these medications which have NOT CHANGED   Details  aspirin-acetaminophen-caffeine (EXCEDRIN EXTRA STRENGTH) 250-250-65 MG per tablet Take 1 tablet by mouth every 6 (six) hours as needed for headache (pain).    brinzolamide (AZOPT) 1 % ophthalmic suspension Place 1 drop into both eyes 2 (two) times daily.     Calcium Carb-Cholecalciferol (CALCIUM + D3) 600-200 MG-UNIT TABS Take 1 tablet by mouth daily.    docusate sodium 100 MG CAPS Take 100 mg by mouth 2 (two) times daily. Qty: 10 capsule, Refills: 0    gabapentin (NEURONTIN) 100 MG capsule Take 1 capsule (100 mg total) by mouth 3 (three) times daily. Qty: 90 capsule, Refills: 3     Garlic 194 MG TABS Take 100 mg by mouth daily.     hydrochlorothiazide (HYDRODIURIL) 25 MG tablet Take 12.5 mg by mouth every morning.     HYDROcodone-acetaminophen (NORCO/VICODIN) 5-325 MG per tablet Take 1 tablet by mouth every 6 (six) hours as needed for moderate pain.    levothyroxine (SYNTHROID, LEVOTHROID) 75 MCG tablet Take 75 mcg by mouth daily before breakfast.    Magnesium 250 MG TABS Take 1 tablet by mouth daily.    meloxicam (MOBIC) 15 MG tablet Take 15 mg by mouth daily.    moexipril (UNIVASC) 7.5 MG tablet Take 7.5 mg by mouth daily.     Multiple Vitamin (MULTIVITAMIN WITH MINERALS) TABS tablet Take 1 tablet by mouth daily.    Omega-3 Fatty Acids (FISH OIL) 1000 MG CAPS Take 1,000 mg by mouth daily.    omeprazole (PRILOSEC) 20 MG capsule Take 20 mg by mouth every morning.     polyethylene glycol (MIRALAX / GLYCOLAX)  packet Take 17 g by mouth 2 (two) times daily as needed for mild constipation (constipation).    pravastatin (PRAVACHOL) 40 MG tablet Take 40 mg by mouth at bedtime.     vitamin B-12 (CYANOCOBALAMIN) 1000 MCG tablet Take 1,000 mcg by mouth daily.    vitamin C (ASCORBIC ACID) 500 MG tablet Take 500 mg by mouth daily.       No Known Allergies Follow-up Information   Follow up with [name],[name], MD. Schedule an appointment as soon as possible for a visit in 1 week.   Specialty:  Internal Medicine   Contact information:   [address] [place] [address] [phone]       Please follow up. (f/u with your surgeon)       Follow up with Odis Hollingshead, MD. (f/u as needed)    Specialty:  General Surgery   Contact information:   [place] [address] [phone]        The results of significant diagnostics from this hospitalization (including imaging, microbiology, ancillary and laboratory) are listed below for reference.    Significant Diagnostic Studies: Ct Abdomen Pelvis W  Contrast  06/07/2014   ADDENDUM REPORT: 06/07/2014 00:22  ADDENDUM: Corrected Findings and Impression:  FINDINGS: Mild dependent atelectasis in the lung bases. Aortic and mitral valve calcification. Small esophageal hiatal hernia. Residual contrast material in the lower esophagus suggesting reflux or dysmotility. Lower esophageal wall thickening suggesting reflux disease.  The liver, spleen, gallbladder, pancreas, adrenal glands, inferior vena cava, and retroperitoneal lymph nodes are unremarkable. Small parenchymal cysts in the kidneys. No solid mass or hydronephrosis. Calcification of the abdominal aorta without aneurysm. Stomach and small bowel appear normal. Stool-filled colon without distention or wall thickening. There is a right flank SPIGELIAN TYPE anterior abdominal wall hernia containing a portion of the ascending colon. No evidence of proximal obstruction.  Pelvis: Bladder wall is not thickened. Uterus appears surgically absent. No pelvic mass or lymphadenopathy. No free or loculated pelvic fluid collections. No evidence of diverticulitis. Degenerative changes and diffuse demineralization throughout the spine. Spondylolysis at L4-5 with a mild spondylolisthesis.  IMPRESSION: Right lower quadrant abdominal wall SPIGELIAN type hernia containing the ascending colon without evidence of obstruction. Small esophageal hiatal hernia with changes likely due to reflux in the lower esophagus.   Electronically Signed   By: Lucienne Capers M.D.   On: 06/07/2014 00:22   06/07/2014   CLINICAL DATA:  Right lower quadrant hernia pain tonight.  EXAM: CT ABDOMEN AND PELVIS WITH CONTRAST  TECHNIQUE: Multidetector CT imaging of the abdomen and pelvis was performed using the standard protocol following bolus administration of intravenous contrast.  CONTRAST:  165mL OMNIPAQUE IOHEXOL 300 MG/ML  SOLN  COMPARISON:  02/22/2009  FINDINGS: Mild dependent atelectasis in the lung bases. Aortic and mitral valve calcification.  Small esophageal hiatal hernia. Residual contrast material in the lower esophagus suggesting reflux or dysmotility. Lower esophageal wall thickening suggesting reflux disease.  The liver, spleen, gallbladder, pancreas, adrenal glands, inferior vena cava, and retroperitoneal lymph nodes are unremarkable. Small parenchymal cysts in the kidneys. No solid mass or hydronephrosis. Calcification of the abdominal aorta without aneurysm. Stomach and small bowel appear normal. Stool-filled colon without distention or wall thickening. There is a right flank speed daily in tight anterior abdominal wall hernia containing a portion of the ascending colon. No evidence of proximal obstruction.  Pelvis: Bladder wall is not thickened. Uterus appears surgically absent. No pelvic mass or lymphadenopathy. No  free or loculated pelvic fluid collections. No evidence of diverticulitis. Degenerative changes and diffuse demineralization throughout the spine. Spondylolysis at L4-5 with a mild spondylolisthesis.  IMPRESSION: Right lower quadrant abdominal walls be daily in type hernia containing the ascending colon without evidence of obstruction. Small esophageal hiatal hernia with changes likely due to reflux in the lower esophagus.  Electronically Signed: By: Lucienne Capers M.D. On: 06/06/2014 23:58    Microbiology: No results found for this or any previous visit (from the past 240 hour(s)).   Labs: Basic Metabolic Panel:  Recent Labs Lab 06/06/14 2207 06/07/14 0500 06/08/14 0619 06/09/14 0527  NA 139 137 139 140  K 4.1 3.7 4.0 3.9  CL 102 100 101 101  CO2 25 27 27 27   GLUCOSE 108* 102* 106* 95  BUN 23 19 17 19   CREATININE 0.79 0.81 0.79 0.74  CALCIUM 10.0 9.4 9.5 9.9   Liver Function Tests:  Recent Labs Lab 06/06/14 2207  AST 22  ALT 16  ALKPHOS 46  BILITOT 0.2*  PROT 6.7  ALBUMIN 3.7    Recent Labs Lab 06/06/14 2207  LIPASE 21   No results found for this basename: AMMONIA,  in the last 168  hours CBC:  Recent Labs Lab 06/06/14 2207 06/07/14 0500 06/08/14 0619  WBC 8.1 6.0 4.7  NEUTROABS 4.4  --   --   HGB 12.1 11.5* 11.9*  HCT 36.8 34.3* 36.6  MCV 91.1 91.2 91.5  PLT 237 193 221   Cardiac Enzymes: No results found for this basename: CKTOTAL, CKMB, CKMBINDEX, TROPONINI,  in the last 168 hours BNP: BNP (last 3 results) No results found for this basename: PROBNP,  in the last 8760 hours CBG: No results found for this basename: GLUCAP,  in the last 168 hours     Signed:  [facility] MD [facility] 06/09/2014, 2:01 PM

## 2014-06-09 NOTE — Progress Notes (Signed)
Nurse reviewed discharge instructions with pt.  Pt verbalized understanding of discharge instructions and follow up appointments.  No concerns at time of discharge.  Pt given abdominal binder per MD request for discharge.

## 2014-06-30 ENCOUNTER — Other Ambulatory Visit: Payer: Self-pay | Admitting: Neurology

## 2014-07-02 ENCOUNTER — Encounter: Payer: Self-pay | Admitting: Neurology

## 2014-07-08 ENCOUNTER — Encounter: Payer: Self-pay | Admitting: Neurology

## 2015-01-09 NOTE — H&P (Signed)
TOTAL KNEE ADMISSION H&P  Patient is being admitted for left total knee arthroplasty.  Subjective:  Chief Complaint:   Left knee primary OA / pain.  HPI: [name], 81 y.o. female, has a history of pain and functional disability in the left knee due to arthritis and has failed non-surgical conservative treatments for greater than 12 weeks to includeNSAID's and/or analgesics, corticosteriod injections, viscosupplementation injections, use of assistive devices and activity modification.  Onset of symptoms was gradual, starting 1+ years ago with rapidlly worsening course since that time. The patient noted prior procedures on the knee to include  arthroplasty on the right knee in June 2015 per Dr. Alvan Dame.  Patient currently rates pain in the left knee(s) at 10 out of 10 with activity. Patient has worsening of pain with activity and weight bearing, pain that interferes with activities of daily living, pain with passive range of motion, crepitus and joint swelling.  Patient has evidence of periarticular osteophytes and joint space narrowing by imaging studies.  There is no active infection.  Risks, benefits and expectations were discussed with the patient.  Risks including but not limited to the risk of anesthesia, blood clots, nerve damage, blood vessel damage, failure of the prosthesis, infection and up to and including death.  Patient understand the risks, benefits and expectations and wishes to proceed with surgery.   PCP: [name], MD  D/C Plans:      SNF ([name] [place], [place])  Post-op Meds:       No Rx given  Tranexamic Acid:      To be given - IV   Decadron:      Is to be given  FYI:     ASA post-op  Norco post-op    Patient Active Problem List   Diagnosis Date Noted  . Cardiac murmur 06/09/2014  . Abdominal hernia 06/07/2014  . Abdominal pain, RLQ 06/07/2014  . Chronic pain   . Hypertension   . Hypothyroidism   . Overweight (BMI 25.0-29.9) 01/22/2014  . Expected  blood loss anemia 01/22/2014  . S/P right TKA 01/21/2014  . Polyneuropathy in other diseases classified elsewhere 01/02/2014   Past Medical History  Diagnosis Date  . Spinal stenosis   . Spondylosis of lumbosacral region   . Scoliosis   . Shoulder pain   . Osteoarthritis     both knees  . Knee pain     bilateral  . Heart murmur   . Hypothyroidism   . Chronic pain   . Polyneuropathy in other diseases classified elsewhere 01/02/2014  . Difficulty swallowing     in the morning  . Glaucoma   . Hypertension     "not high- on medication for precaution"  . GERD (gastroesophageal reflux disease)     occasional  . H/O rotator cuff tear 10 years ago    right  . Equilibrium disorder   . Double vision     right eye    Past Surgical History  Procedure Laterality Date  . Oophorectomy    . Cataract extraction      bilateral  . Dilation and curettage of uterus    . Esophageal dilation  ~2012  . Inguinal hernia repair Bilateral   . Shoulder surgery Left     "rebuilt"  . Total abdominal hysterectomy  1991    with bladder tac  . Total knee arthroplasty Right 01/21/2014    Procedure: RIGHT TOTAL KNEE ARTHROPLASTY;  Surgeon: Mauri Pole, MD;  Location: [facility];  Service: Orthopedics;  Laterality: Right;    No prescriptions prior to admission   No Known Allergies   History  Substance Use Topics  . Smoking status: Never Smoker   . Smokeless tobacco: Never Used  . Alcohol Use: No    Family History  Problem Relation Age of Onset  . CVA Mother   . CVA Father   . Cancer Brother      Review of Systems  Constitutional: Negative.   HENT: Negative.   Eyes: Negative.   Respiratory: Negative.   Cardiovascular: Negative.   Gastrointestinal: Positive for heartburn and constipation.  Genitourinary: Negative.   Musculoskeletal: Positive for myalgias, back pain and joint pain.  Skin: Negative.   Neurological: Negative.   Endo/Heme/Allergies: Negative.   Psychiatric/Behavioral:  Negative.     Objective:  Physical Exam  Constitutional: She is oriented to person, place, and time. She appears well-developed and well-nourished.  HENT:  Head: Normocephalic and atraumatic.  Mouth/Throat: She has dentures.  Eyes: Pupils are equal, round, and reactive to light.  Neck: Neck supple. No JVD present. No tracheal deviation present. No thyromegaly present.  Cardiovascular: Normal rate, regular rhythm and intact distal pulses.   Murmur heard. Respiratory: Effort normal and breath sounds normal. No stridor. No respiratory distress. She has no wheezes.  GI: Soft. There is no tenderness. There is no guarding.  Musculoskeletal:       Left knee: She exhibits decreased range of motion, swelling, abnormal alignment (valgus) and bony tenderness. She exhibits no ecchymosis, no deformity, no laceration and no erythema. Tenderness found.  Lymphadenopathy:    She has no cervical adenopathy.  Neurological: She is alert and oriented to person, place, and time. A sensory deficit (Bilateral LE neuropathy as well as numbness in  bilateral hands) is present.  Skin: Skin is warm and dry.  Psychiatric: She has a normal mood and affect.      Labs:  Estimated body mass index is 27.00 kg/(m^2) as calculated from the following:   Height as of 06/07/14: 5\' 3"  (1.6 m).   Weight as of 06/06/14: 69.128 kg (152 lb 6.4 oz).   Imaging Review Plain radiographs demonstrate severe degenerative joint disease of the left knee(s). The overall alignment is significant valgus. The bone quality appears to be good for age and reported activity level.  Assessment/Plan:  End stage arthritis, left knee   The patient history, physical examination, clinical judgment of the provider and imaging studies are consistent with end stage degenerative joint disease of the left knee(s) and total knee arthroplasty is deemed medically necessary. The treatment options including medical management, injection therapy  arthroscopy and arthroplasty were discussed at length. The risks and benefits of total knee arthroplasty were presented and reviewed. The risks due to aseptic loosening, infection, stiffness, patella tracking problems, thromboembolic complications and other imponderables were discussed. The patient acknowledged the explanation, agreed to proceed with the plan and consent was signed. Patient is being admitted for inpatient treatment for surgery, pain control, PT, OT, prophylactic antibiotics, VTE prophylaxis, progressive ambulation and ADL's and discharge planning. The patient is planning to be discharged to skilled nursing facility.      [place] Pugh Dearia [place]mouth   PA-C  01/09/2015, 4:58 PM

## 2015-01-14 NOTE — Patient Instructions (Addendum)
YOUR PROCEDURE IS SCHEDULED ON :  12/20/14  REPORT TO [facility] MAIN ENTRANCE FOLLOW SIGNS TO SHORT STAY CENTER AT :  2:15 PM  CALL THIS NUMBER IF YOU HAVE PROBLEMS THE MORNING OF SURGERY [phone]  REMEMBER:  DO NOT EAT FOOD  AFTER MIDNIGHT  MAY HAVE CLEAR LIQUIDS UNTIL 11:00 AM    CLEAR LIQUID DIET   Foods Allowed                                                                     Foods Excluded  Coffee and tea, regular and decaf                             liquids that you cannot  Plain Jell-O in any flavor                                             see through such as: Fruit ices (not with fruit pulp)                                     milk, soups, orange juice  Iced Popsicles                                    All solid food Carbonated beverages, regular and diet                                    Cranberry, grape and apple juices Sports drinks like Gatorade Lightly seasoned clear broth or consume(fat free) Sugar, honey syrup ___________________________________________________________________  TAKE THESE MEDICINES THE MORNING OF SURGERY: SYNTHROID / OMEPRAZOLE / GABAPENTIN / USE EYE DROPS / MAY TAKE NORCO IF NEEDED   YOU MAY NOT HAVE ANY METAL ON YOUR BODY INCLUDING HAIR PINS AND PIERCING'S. DO NOT WEAR JEWELRY, MAKEUP, LOTIONS, POWDERS OR PERFUMES. DO NOT WEAR NAIL POLISH. DO NOT SHAVE 48 HRS PRIOR TO SURGERY. MEN MAY SHAVE FACE AND NECK.  DO NOT [place]. [facility] IS NOT RESPONSIBLE FOR VALUABLES.  CONTACTS, DENTURES OR PARTIALS MAY NOT BE WORN TO SURGERY. LEAVE SUITCASE IN CAR. CAN BE BROUGHT TO ROOM AFTER SURGERY.  PATIENTS DISCHARGED THE DAY OF SURGERY WILL NOT BE ALLOWED TO DRIVE HOME.  PLEASE READ OVER THE FOLLOWING INSTRUCTION SHEETS _________________________________________________________________________________                                          [name] - PREPARING FOR SURGERY  Before surgery,  you can play an important role.  Because skin is not sterile, your skin needs to be as free of germs as possible.  You can reduce the number of germs on your skin by washing with CHG (chlorahexidine gluconate) soap before  surgery.  CHG is an antiseptic cleaner which kills germs and bonds with the skin to continue killing germs even after washing. Please DO NOT use if you have an allergy to CHG or antibacterial soaps.  If your skin becomes reddened/irritated stop using the CHG and inform your nurse when you arrive at Short Stay. Do not shave (including legs and underarms) for at least 48 hours prior to the first CHG shower.  You may shave your face. Please follow these instructions carefully:   1.  Shower with CHG Soap the night before surgery and the  morning of Surgery.   2.  If you choose to wash your hair, wash your hair first as usual with your  normal  Shampoo.   3.  After you shampoo, rinse your hair and body thoroughly to remove the  shampoo.                                         4.  Use CHG as you would any other liquid soap.  You can apply chg directly  to the skin and wash . Gently wash with scrungie or clean wascloth    5.  Apply the CHG Soap to your body ONLY FROM THE NECK DOWN.   Do not use on open                           Wound or open sores. Avoid contact with eyes, ears mouth and genitals (private parts).                        Genitals (private parts) with your normal soap.              6.  Wash thoroughly, paying special attention to the area where your surgery  will be performed.   7.  Thoroughly rinse your body with warm water from the neck down.   8.  DO NOT shower/wash with your normal soap after using and rinsing off  the CHG Soap .                9.  Pat yourself dry with a clean towel.             10.  Wear clean night clothes to bed after shower             11.  Place clean sheets on your bed the night of your first shower and do not  sleep with pets.  Day  of Surgery : Do not apply any lotions/deodorants the morning of surgery.  Please wear clean clothes to the hospital/surgery center.  FAILURE TO FOLLOW THESE INSTRUCTIONS MAY RESULT IN THE CANCELLATION OF YOUR SURGERY    PATIENT SIGNATURE_________________________________  ______________________________________________________________________     [name]  An incentive spirometer is a tool that can help keep your lungs clear and active. This tool measures how well you are filling your lungs with each breath. Taking long deep breaths may help reverse or decrease the chance of developing breathing (pulmonary) problems (especially infection) following:  A long period of time when you are unable to move or be active. BEFORE THE PROCEDURE   If the spirometer includes an indicator to show your best effort, your nurse or respiratory therapist will set it to a desired goal.  If possible, sit  up straight or lean slightly forward. Try not to slouch.  Hold the incentive spirometer in an upright position. INSTRUCTIONS FOR USE   Sit on the edge of your bed if possible, or sit up as far as you can in bed or on a chair.  Hold the incentive spirometer in an upright position.  Breathe out normally.  Place the mouthpiece in your mouth and seal your lips tightly around it.  Breathe in slowly and as deeply as possible, raising the piston or the ball toward the top of the column.  Hold your breath for 3-5 seconds or for as long as possible. Allow the piston or ball to fall to the bottom of the column.  Remove the mouthpiece from your mouth and breathe out normally.  Rest for a few seconds and repeat Steps 1 through 7 at least 10 times every 1-2 hours when you are awake. Take your time and take a few normal breaths between deep breaths.  The spirometer may include an indicator to show your best effort. Use the indicator as a goal to work toward during each repetition.  After each  set of 10 deep breaths, practice coughing to be sure your lungs are clear. If you have an incision (the cut made at the time of surgery), support your incision when coughing by placing a pillow or rolled up towels firmly against it. Once you are able to get out of bed, walk around indoors and cough well. You may stop using the incentive spirometer when instructed by your caregiver.  RISKS AND COMPLICATIONS  Take your time so you do not get dizzy or light-headed.  If you are in pain, you may need to take or ask for pain medication before doing incentive spirometry. It is harder to take a deep breath if you are having pain. AFTER USE  Rest and breathe slowly and easily.  It can be helpful to keep track of a log of your progress. Your caregiver can provide you with a simple table to help with this. If you are using the spirometer at home, follow these instructions: [place] IF:   You are having difficultly using the spirometer.  You have trouble using the spirometer as often as instructed.  Your pain medication is not giving enough relief while using the spirometer.  You develop fever of 100.5 F (38.1 C) or higher. SEEK IMMEDIATE MEDICAL CARE IF:   You cough up bloody sputum that had not been present before.  You develop fever of 102 F (38.9 C) or greater.  You develop worsening pain at or near the incision site. MAKE SURE YOU:   Understand these instructions.  Will watch your condition.  Will get help right away if you are not doing well or get worse. Document Released: 12/12/2006 Document Revised: 10/24/2011 Document Reviewed: 02/12/2007 ExitCare Patient Information 2014 ExitCare, [place].   ________________________________________________________________________  WHAT IS A BLOOD TRANSFUSION? Blood Transfusion Information  A transfusion is the replacement of blood or some of its parts. Blood is made up of multiple cells which provide different functions.  Red  blood cells carry oxygen and are used for blood loss replacement.  White blood cells fight against infection.  Platelets control bleeding.  Plasma helps clot blood.  Other blood products are available for specialized needs, such as hemophilia or other clotting disorders. BEFORE THE TRANSFUSION  Who gives blood for transfusions?   Healthy volunteers who are fully evaluated to make sure their blood is safe. This is  blood bank blood. Transfusion therapy is the safest it has ever been in the practice of medicine. Before blood is taken from a donor, a complete history is taken to make sure that person has no history of diseases nor engages in risky social behavior (examples are intravenous drug use or sexual activity with multiple partners). The donor's travel history is screened to minimize risk of transmitting infections, such as malaria. The donated blood is tested for signs of infectious diseases, such as HIV and hepatitis. The blood is then tested to be sure it is compatible with you in order to minimize the chance of a transfusion reaction. If you or a relative donates blood, this is often done in anticipation of surgery and is not appropriate for emergency situations. It takes many days to process the donated blood. RISKS AND COMPLICATIONS Although transfusion therapy is very safe and saves many lives, the main dangers of transfusion include:   Getting an infectious disease.  Developing a transfusion reaction. This is an allergic reaction to something in the blood you were given. Every precaution is taken to prevent this. The decision to have a blood transfusion has been considered carefully by your caregiver before blood is given. Blood is not given unless the benefits outweigh the risks. AFTER THE TRANSFUSION  Right after receiving a blood transfusion, you will usually feel much better and more energetic. This is especially true if your red blood cells have gotten low (anemic). The  transfusion raises the level of the red blood cells which carry oxygen, and this usually causes an energy increase.  The nurse administering the transfusion will monitor you carefully for complications. HOME CARE INSTRUCTIONS  No special instructions are needed after a transfusion. You may find your energy is better. Speak with your caregiver about any limitations on activity for underlying diseases you may have. SEEK MEDICAL CARE IF:   Your condition is not improving after your transfusion.  You develop redness or irritation at the intravenous (IV) site. SEEK IMMEDIATE MEDICAL CARE IF:  Any of the following symptoms occur over the next 12 hours:  Shaking chills.  You have a temperature by mouth above 102 F (38.9 C), not controlled by medicine.  Chest, back, or muscle pain.  People around you feel you are not acting correctly or are confused.  Shortness of breath or difficulty breathing.  Dizziness and fainting.  You get a rash or develop hives.  You have a decrease in urine output.  Your urine turns a dark color or changes to pink, red, or brown. Any of the following symptoms occur over the next 10 days:  You have a temperature by mouth above 102 F (38.9 C), not controlled by medicine.  Shortness of breath.  Weakness after normal activity.  The white part of the eye turns yellow (jaundice).  You have a decrease in the amount of urine or are urinating less often.  Your urine turns a dark color or changes to pink, red, or brown. Document Released: 07/29/2000 Document Revised: 10/24/2011 Document Reviewed: 03/17/2008 [facility] Patient Information 2014 [place], [place].  _______________________________________________________________________

## 2015-01-15 ENCOUNTER — Encounter (HOSPITAL_COMMUNITY): Payer: Self-pay

## 2015-01-15 ENCOUNTER — Encounter (HOSPITAL_COMMUNITY)
Admission: RE | Admit: 2015-01-15 | Discharge: 2015-01-15 | Disposition: A | Discharge status: Home / Self Care | Payer: Medicare Other | Source: Ambulatory Visit | Attending: Orthopedic Surgery | Admitting: Orthopedic Surgery

## 2015-01-15 DIAGNOSIS — Z01812 Encounter for preprocedural laboratory examination: Secondary | ICD-10-CM | POA: Diagnosis present

## 2015-01-15 DIAGNOSIS — M1712 Unilateral primary osteoarthritis, left knee: Secondary | ICD-10-CM | POA: Diagnosis not present

## 2015-01-15 HISTORY — DX: Unspecified urinary incontinence: R32

## 2015-01-15 HISTORY — DX: Constipation, unspecified: K59.00

## 2015-01-15 HISTORY — DX: Xerosis cutis: L85.3

## 2015-01-15 HISTORY — DX: Malignant (primary) neoplasm, unspecified: C80.1

## 2015-01-15 LAB — CBC
HCT: 38.1 % (ref 36.0–46.0)
HEMOGLOBIN: 12.2 g/dL (ref 12.0–15.0)
MCH: 29.5 pg (ref 26.0–34.0)
MCHC: 32 g/dL (ref 30.0–36.0)
MCV: 92 fL (ref 78.0–100.0)
Platelets: 225 10*3/uL (ref 150–400)
RBC: 4.14 MIL/uL (ref 3.87–5.11)
RDW: 13.7 % (ref 11.5–15.5)
WBC: 6.3 10*3/uL (ref 4.0–10.5)

## 2015-01-15 LAB — URINALYSIS, ROUTINE W REFLEX MICROSCOPIC
Bilirubin Urine: NEGATIVE
GLUCOSE, UA: NEGATIVE mg/dL
Hgb urine dipstick: NEGATIVE
KETONES UR: NEGATIVE mg/dL
Leukocytes, UA: NEGATIVE
Nitrite: NEGATIVE
Protein, ur: NEGATIVE mg/dL
SPECIFIC GRAVITY, URINE: 1.026 (ref 1.005–1.030)
UROBILINOGEN UA: 0.2 mg/dL (ref 0.0–1.0)
pH: 6.5 (ref 5.0–8.0)

## 2015-01-15 LAB — BASIC METABOLIC PANEL
Anion gap: 9 (ref 5–15)
BUN: 23 mg/dL — ABNORMAL HIGH (ref 6–20)
CALCIUM: 9.8 mg/dL (ref 8.9–10.3)
CO2: 31 mmol/L (ref 22–32)
Chloride: 99 mmol/L — ABNORMAL LOW (ref 101–111)
Creatinine, Ser: 0.82 mg/dL (ref 0.44–1.00)
GFR calc non Af Amer: >60 mL/min (ref >60)
Glucose, Bld: 110 mg/dL — ABNORMAL HIGH (ref 65–99)
Potassium: 3.7 mmol/L (ref 3.5–5.1)
SODIUM: 139 mmol/L (ref 135–145)

## 2015-01-15 LAB — SURGICAL PCR SCREEN
MRSA, PCR: NEGATIVE
Staphylococcus aureus: POSITIVE — AB

## 2015-01-15 LAB — PROTIME-INR
INR: 0.98 (ref 0.00–1.49)
PROTHROMBIN TIME: 13.2 s (ref 11.6–15.2)

## 2015-01-15 LAB — APTT: aPTT: 30 seconds (ref 24–37)

## 2015-01-20 ENCOUNTER — Inpatient Hospital Stay (HOSPITAL_COMMUNITY)
Admission: RE | Admit: 2015-01-20 | Discharge: 2015-01-22 | DRG: 470 | Disposition: A | Discharge status: Skilled Nursing Facility | Payer: Medicare Other | Source: Ambulatory Visit | Attending: Orthopedic Surgery | Admitting: Orthopedic Surgery

## 2015-01-20 ENCOUNTER — Encounter (HOSPITAL_COMMUNITY)
Admission: RE | Disposition: A | Discharge status: Skilled Nursing Facility | Payer: Self-pay | Source: Ambulatory Visit | Attending: Orthopedic Surgery

## 2015-01-20 ENCOUNTER — Inpatient Hospital Stay (HOSPITAL_COMMUNITY): Payer: Medicare Other | Admitting: Anesthesiology

## 2015-01-20 ENCOUNTER — Encounter (HOSPITAL_COMMUNITY): Payer: Self-pay | Admitting: *Deleted

## 2015-01-20 DIAGNOSIS — K469 Unspecified abdominal hernia without obstruction or gangrene: Secondary | ICD-10-CM | POA: Diagnosis present

## 2015-01-20 DIAGNOSIS — Z90710 Acquired absence of both cervix and uterus: Secondary | ICD-10-CM | POA: Diagnosis not present

## 2015-01-20 DIAGNOSIS — M419 Scoliosis, unspecified: Secondary | ICD-10-CM | POA: Diagnosis present

## 2015-01-20 DIAGNOSIS — G629 Polyneuropathy, unspecified: Secondary | ICD-10-CM | POA: Diagnosis present

## 2015-01-20 DIAGNOSIS — Z6827 Body mass index (BMI) 27.0-27.9, adult: Secondary | ICD-10-CM | POA: Diagnosis not present

## 2015-01-20 DIAGNOSIS — Z85828 Personal history of other malignant neoplasm of skin: Secondary | ICD-10-CM

## 2015-01-20 DIAGNOSIS — K5900 Constipation, unspecified: Secondary | ICD-10-CM | POA: Diagnosis present

## 2015-01-20 DIAGNOSIS — R011 Cardiac murmur, unspecified: Secondary | ICD-10-CM | POA: Diagnosis present

## 2015-01-20 DIAGNOSIS — E663 Overweight: Secondary | ICD-10-CM | POA: Diagnosis present

## 2015-01-20 DIAGNOSIS — M47817 Spondylosis without myelopathy or radiculopathy, lumbosacral region: Secondary | ICD-10-CM | POA: Diagnosis present

## 2015-01-20 DIAGNOSIS — K219 Gastro-esophageal reflux disease without esophagitis: Secondary | ICD-10-CM | POA: Diagnosis present

## 2015-01-20 DIAGNOSIS — Z90721 Acquired absence of ovaries, unilateral: Secondary | ICD-10-CM | POA: Diagnosis present

## 2015-01-20 DIAGNOSIS — E039 Hypothyroidism, unspecified: Secondary | ICD-10-CM | POA: Diagnosis present

## 2015-01-20 DIAGNOSIS — G8929 Other chronic pain: Secondary | ICD-10-CM | POA: Diagnosis present

## 2015-01-20 DIAGNOSIS — H409 Unspecified glaucoma: Secondary | ICD-10-CM | POA: Diagnosis present

## 2015-01-20 DIAGNOSIS — Z96659 Presence of unspecified artificial knee joint: Secondary | ICD-10-CM

## 2015-01-20 DIAGNOSIS — E785 Hyperlipidemia, unspecified: Secondary | ICD-10-CM | POA: Diagnosis present

## 2015-01-20 DIAGNOSIS — M4800 Spinal stenosis, site unspecified: Secondary | ICD-10-CM | POA: Diagnosis present

## 2015-01-20 DIAGNOSIS — Z96651 Presence of right artificial knee joint: Secondary | ICD-10-CM | POA: Diagnosis present

## 2015-01-20 DIAGNOSIS — I10 Essential (primary) hypertension: Secondary | ICD-10-CM | POA: Diagnosis present

## 2015-01-20 DIAGNOSIS — M1712 Unilateral primary osteoarthritis, left knee: Principal | ICD-10-CM | POA: Diagnosis present

## 2015-01-20 DIAGNOSIS — Z96652 Presence of left artificial knee joint: Secondary | ICD-10-CM

## 2015-01-20 HISTORY — PX: TOTAL KNEE ARTHROPLASTY: SHX125

## 2015-01-20 LAB — TYPE AND SCREEN
ABO/RH(D): A POS
Antibody Screen: NEGATIVE

## 2015-01-20 SURGERY — ARTHROPLASTY, KNEE, TOTAL
Anesthesia: Spinal | Site: Knee | Laterality: Left

## 2015-01-20 MED ORDER — ONDANSETRON HCL 4 MG PO TABS: 4.0000 mg | ORAL_TABLET | Freq: Four times a day (QID) | ORAL | Status: DC | PRN

## 2015-01-20 MED ORDER — PHENOL 1.4 % MT LIQD
1.0000 | OROMUCOSAL | Status: DC | PRN
  Filled 2015-01-20: qty 177

## 2015-01-20 MED ORDER — BIOTIN 5 MG PO CAPS: 1.0000 | ORAL_CAPSULE | Freq: Every morning | ORAL | Status: DC

## 2015-01-20 MED ORDER — FERROUS SULFATE 325 (65 FE) MG PO TABS
325.0000 mg | ORAL_TABLET | Freq: Three times a day (TID) | ORAL | Status: DC
  Administered 2015-01-21 – 2015-01-22 (×3): 325 mg via ORAL
  Filled 2015-01-20 (×7): qty 1

## 2015-01-20 MED ORDER — PRAVASTATIN SODIUM 40 MG PO TABS
40.0000 mg | ORAL_TABLET | Freq: Every day | ORAL | Status: DC
  Administered 2015-01-20 – 2015-01-21 (×2): 40 mg via ORAL
  Filled 2015-01-20 (×3): qty 1

## 2015-01-20 MED ORDER — PROPOFOL 10 MG/ML IV BOLUS
INTRAVENOUS | Status: DC | PRN
  Administered 2015-01-20: 20 mg via INTRAVENOUS
  Administered 2015-01-20: 10 mg via INTRAVENOUS

## 2015-01-20 MED ORDER — HYDROMORPHONE HCL 1 MG/ML IJ SOLN: 0.2500 mg | INTRAMUSCULAR | Status: DC | PRN

## 2015-01-20 MED ORDER — KETOROLAC TROMETHAMINE 30 MG/ML IJ SOLN
INTRAMUSCULAR | Status: AC
  Filled 2015-01-20: qty 1

## 2015-01-20 MED ORDER — KETOROLAC TROMETHAMINE 30 MG/ML IJ SOLN
INTRAMUSCULAR | Status: DC | PRN
  Administered 2015-01-20: 30 mg via INTRAVENOUS

## 2015-01-20 MED ORDER — HYDROCODONE-ACETAMINOPHEN 7.5-325 MG PO TABS
1.0000 | ORAL_TABLET | ORAL | Status: DC
  Administered 2015-01-20 – 2015-01-21 (×5): 1 via ORAL
  Administered 2015-01-21: 2 via ORAL
  Administered 2015-01-21 – 2015-01-22 (×3): 1 via ORAL
  Administered 2015-01-22: 2 via ORAL
  Filled 2015-01-20 (×4): qty 1
  Filled 2015-01-20: qty 2
  Filled 2015-01-20 (×3): qty 1
  Filled 2015-01-20: qty 2
  Filled 2015-01-20: qty 1

## 2015-01-20 MED ORDER — PROPOFOL 10 MG/ML IV BOLUS
INTRAVENOUS | Status: AC
  Filled 2015-01-20: qty 20

## 2015-01-20 MED ORDER — ONDANSETRON HCL 4 MG/2ML IJ SOLN
INTRAMUSCULAR | Status: DC | PRN
  Administered 2015-01-20: 4 mg via INTRAVENOUS

## 2015-01-20 MED ORDER — 0.9 % SODIUM CHLORIDE (POUR BTL) OPTIME
TOPICAL | Status: DC | PRN
Start: 2015-01-20 — End: 2015-01-20
  Administered 2015-01-20: 1000 mL

## 2015-01-20 MED ORDER — DOCUSATE SODIUM 100 MG PO CAPS
100.0000 mg | ORAL_CAPSULE | Freq: Two times a day (BID) | ORAL | Status: DC
  Administered 2015-01-20 – 2015-01-22 (×3): 100 mg via ORAL

## 2015-01-20 MED ORDER — ALUM & MAG HYDROXIDE-SIMETH 200-200-20 MG/5ML PO SUSP: 30.0000 mL | ORAL | Status: DC | PRN

## 2015-01-20 MED ORDER — DEXAMETHASONE SODIUM PHOSPHATE 10 MG/ML IJ SOLN
10.0000 mg | Freq: Once | INTRAMUSCULAR | Status: AC
  Administered 2015-01-20: 10 mg via INTRAVENOUS

## 2015-01-20 MED ORDER — DEXTROSE 5 % IV SOLN
500.0000 mg | Freq: Four times a day (QID) | INTRAVENOUS | Status: DC | PRN
  Filled 2015-01-20: qty 5

## 2015-01-20 MED ORDER — CHLORHEXIDINE GLUCONATE 4 % EX LIQD: 60.0000 mL | Freq: Once | CUTANEOUS | Status: DC

## 2015-01-20 MED ORDER — CELECOXIB 200 MG PO CAPS
200.0000 mg | ORAL_CAPSULE | Freq: Two times a day (BID) | ORAL | Status: DC
  Administered 2015-01-20 – 2015-01-22 (×4): 200 mg via ORAL
  Filled 2015-01-20 (×5): qty 1

## 2015-01-20 MED ORDER — HYDROMORPHONE HCL 1 MG/ML IJ SOLN: 0.5000 mg | INTRAMUSCULAR | Status: DC | PRN

## 2015-01-20 MED ORDER — PROPOFOL INFUSION 10 MG/ML OPTIME
INTRAVENOUS | Status: DC | PRN
  Administered 2015-01-20: 75 ug/kg/min via INTRAVENOUS

## 2015-01-20 MED ORDER — OMEPRAZOLE 20 MG PO CPDR
20.0000 mg | DELAYED_RELEASE_CAPSULE | Freq: Every day | ORAL | Status: DC
  Administered 2015-01-21 – 2015-01-22 (×2): 20 mg via ORAL
  Filled 2015-01-20 (×3): qty 1

## 2015-01-20 MED ORDER — DEXAMETHASONE SODIUM PHOSPHATE 10 MG/ML IJ SOLN
INTRAMUSCULAR | Status: AC
Start: 2015-01-20 — End: 2015-01-20
  Filled 2015-01-20: qty 1

## 2015-01-20 MED ORDER — MAGNESIUM CITRATE PO SOLN: 1.0000 | Freq: Once | ORAL | Status: AC | PRN

## 2015-01-20 MED ORDER — FENTANYL CITRATE (PF) 100 MCG/2ML IJ SOLN
INTRAMUSCULAR | Status: AC
  Filled 2015-01-20: qty 2

## 2015-01-20 MED ORDER — SODIUM CHLORIDE 0.9 % IJ SOLN
INTRAMUSCULAR | Status: AC
  Filled 2015-01-20: qty 50

## 2015-01-20 MED ORDER — SODIUM CHLORIDE 0.9 % IV SOLN
INTRAVENOUS | Status: DC
  Administered 2015-01-20: 23:00:00 via INTRAVENOUS
  Filled 2015-01-20 (×4): qty 1000

## 2015-01-20 MED ORDER — LACTATED RINGERS IV SOLN
INTRAVENOUS | Status: DC
  Administered 2015-01-20: 17:00:00 via INTRAVENOUS
  Administered 2015-01-20: 1000 mL via INTRAVENOUS

## 2015-01-20 MED ORDER — POLYETHYLENE GLYCOL 3350 17 G PO PACK
17.0000 g | PACK | Freq: Two times a day (BID) | ORAL | Status: DC
  Administered 2015-01-20 – 2015-01-22 (×3): 17 g via ORAL

## 2015-01-20 MED ORDER — HYDROCHLOROTHIAZIDE 25 MG PO TABS
25.0000 mg | ORAL_TABLET | Freq: Every morning | ORAL | Status: DC
  Administered 2015-01-21 – 2015-01-22 (×2): 25 mg via ORAL
  Filled 2015-01-20 (×2): qty 1

## 2015-01-20 MED ORDER — BISACODYL 10 MG RE SUPP: 10.0000 mg | Freq: Every day | RECTAL | Status: DC | PRN

## 2015-01-20 MED ORDER — DEXAMETHASONE SODIUM PHOSPHATE 10 MG/ML IJ SOLN
10.0000 mg | Freq: Once | INTRAMUSCULAR | Status: DC
  Filled 2015-01-20: qty 1

## 2015-01-20 MED ORDER — LEVOTHYROXINE SODIUM 75 MCG PO TABS
75.0000 ug | ORAL_TABLET | Freq: Every day | ORAL | Status: DC
  Administered 2015-01-21 – 2015-01-22 (×2): 75 ug via ORAL
  Filled 2015-01-20 (×3): qty 1

## 2015-01-20 MED ORDER — ONDANSETRON HCL 4 MG/2ML IJ SOLN: 4.0000 mg | Freq: Once | INTRAMUSCULAR | Status: DC | PRN

## 2015-01-20 MED ORDER — METOCLOPRAMIDE HCL 5 MG/ML IJ SOLN: 5.0000 mg | Freq: Three times a day (TID) | INTRAMUSCULAR | Status: DC | PRN

## 2015-01-20 MED ORDER — ONDANSETRON HCL 4 MG/2ML IJ SOLN
INTRAMUSCULAR | Status: AC
  Filled 2015-01-20: qty 2

## 2015-01-20 MED ORDER — SODIUM CHLORIDE 0.9 % IV SOLN
10.0000 mg | INTRAVENOUS | Status: DC | PRN
  Administered 2015-01-20: 25 ug/min via INTRAVENOUS

## 2015-01-20 MED ORDER — ASPIRIN EC 325 MG PO TBEC
325.0000 mg | DELAYED_RELEASE_TABLET | Freq: Two times a day (BID) | ORAL | Status: DC
  Administered 2015-01-21 – 2015-01-22 (×2): 325 mg via ORAL
  Filled 2015-01-20 (×5): qty 1

## 2015-01-20 MED ORDER — GABAPENTIN 100 MG PO CAPS
100.0000 mg | ORAL_CAPSULE | Freq: Three times a day (TID) | ORAL | Status: DC
  Administered 2015-01-20 – 2015-01-22 (×5): 100 mg via ORAL
  Filled 2015-01-20 (×7): qty 1

## 2015-01-20 MED ORDER — CEFAZOLIN SODIUM-DEXTROSE 2-3 GM-% IV SOLR
2.0000 g | INTRAVENOUS | Status: AC
  Administered 2015-01-20: 2 g via INTRAVENOUS

## 2015-01-20 MED ORDER — DIPHENHYDRAMINE HCL 25 MG PO CAPS: 25.0000 mg | ORAL_CAPSULE | Freq: Four times a day (QID) | ORAL | Status: DC | PRN

## 2015-01-20 MED ORDER — CEFAZOLIN SODIUM-DEXTROSE 2-3 GM-% IV SOLR
INTRAVENOUS | Status: AC
  Filled 2015-01-20: qty 50

## 2015-01-20 MED ORDER — BUPIVACAINE-EPINEPHRINE (PF) 0.25% -1:200000 IJ SOLN
INTRAMUSCULAR | Status: DC | PRN
Start: 2015-01-20 — End: 2015-01-20
  Administered 2015-01-20: 30 mL via PERINEURAL

## 2015-01-20 MED ORDER — BUPIVACAINE-EPINEPHRINE (PF) 0.25% -1:200000 IJ SOLN
INTRAMUSCULAR | Status: AC
  Filled 2015-01-20: qty 30

## 2015-01-20 MED ORDER — SODIUM CHLORIDE 0.9 % IJ SOLN
INTRAMUSCULAR | Status: DC | PRN
  Administered 2015-01-20: 30 mL via INTRAVENOUS

## 2015-01-20 MED ORDER — MEPERIDINE HCL 50 MG/ML IJ SOLN: 6.2500 mg | INTRAMUSCULAR | Status: DC | PRN

## 2015-01-20 MED ORDER — FENTANYL CITRATE (PF) 100 MCG/2ML IJ SOLN
INTRAMUSCULAR | Status: DC | PRN
  Administered 2015-01-20: 50 ug via INTRAVENOUS

## 2015-01-20 MED ORDER — BRINZOLAMIDE 1 % OP SUSP
1.0000 [drp] | Freq: Two times a day (BID) | OPHTHALMIC | Status: DC
  Administered 2015-01-20 – 2015-01-21 (×3): 1 [drp] via OPHTHALMIC
  Filled 2015-01-20: qty 10

## 2015-01-20 MED ORDER — CEFAZOLIN SODIUM-DEXTROSE 2-3 GM-% IV SOLR
2.0000 g | Freq: Four times a day (QID) | INTRAVENOUS | Status: AC
  Administered 2015-01-20 – 2015-01-21 (×2): 2 g via INTRAVENOUS
  Filled 2015-01-20 (×2): qty 50

## 2015-01-20 MED ORDER — SODIUM CHLORIDE 0.9 % IR SOLN
Status: DC | PRN
  Administered 2015-01-20: 1000 mL

## 2015-01-20 MED ORDER — TRANEXAMIC ACID 1000 MG/10ML IV SOLN
1000.0000 mg | Freq: Once | INTRAVENOUS | Status: DC
  Filled 2015-01-20: qty 10

## 2015-01-20 MED ORDER — METHOCARBAMOL 500 MG PO TABS
500.0000 mg | ORAL_TABLET | Freq: Four times a day (QID) | ORAL | Status: DC | PRN
  Administered 2015-01-20 – 2015-01-21 (×3): 500 mg via ORAL
  Filled 2015-01-20 (×3): qty 1

## 2015-01-20 MED ORDER — MENTHOL 3 MG MT LOZG: 1.0000 | LOZENGE | OROMUCOSAL | Status: DC | PRN

## 2015-01-20 MED ORDER — METOCLOPRAMIDE HCL 10 MG PO TABS: 5.0000 mg | ORAL_TABLET | Freq: Three times a day (TID) | ORAL | Status: DC | PRN

## 2015-01-20 MED ORDER — ONDANSETRON HCL 4 MG/2ML IJ SOLN: 4.0000 mg | Freq: Four times a day (QID) | INTRAMUSCULAR | Status: DC | PRN

## 2015-01-20 SURGICAL SUPPLY — 53 items
BAG DECANTER FOR FLEXI CONT (MISCELLANEOUS) IMPLANT
BAG ZIPLOCK 12X15 (MISCELLANEOUS) IMPLANT
BANDAGE ELASTIC 6 VELCRO ST LF (GAUZE/BANDAGES/DRESSINGS) ×3 IMPLANT
BANDAGE ESMARK 6X9 LF (GAUZE/BANDAGES/DRESSINGS) ×1 IMPLANT
BLADE SAW SGTL 13.0X1.19X90.0M (BLADE) ×3 IMPLANT
BNDG ELASTIC 6X15 VLCR STRL LF (GAUZE/BANDAGES/DRESSINGS) ×3 IMPLANT
BNDG ESMARK 6X9 LF (GAUZE/BANDAGES/DRESSINGS) ×3
BOWL SMART MIX CTS (DISPOSABLE) ×3 IMPLANT
CAP KNEE TOTAL 3 SIGMA ×3 IMPLANT
CEMENT HV SMART SET (Cement) ×6 IMPLANT
CUFF TOURN SGL QUICK 34 (TOURNIQUET CUFF) ×2
CUFF TRNQT CYL 34X4X40X1 (TOURNIQUET CUFF) ×1 IMPLANT
DECANTER SPIKE VIAL GLASS SM (MISCELLANEOUS) ×3 IMPLANT
DRAPE EXTREMITY T 121X128X90 (DRAPE) ×3 IMPLANT
DRAPE POUCH INSTRU U-SHP 10X18 (DRAPES) ×3 IMPLANT
DRAPE U-SHAPE 47X51 STRL (DRAPES) ×3 IMPLANT
DRSG AQUACEL AG ADV 3.5X10 (GAUZE/BANDAGES/DRESSINGS) ×3 IMPLANT
DURAPREP 26ML APPLICATOR (WOUND CARE) ×6 IMPLANT
ELECT REM PT RETURN 9FT ADLT (ELECTROSURGICAL) ×3
ELECTRODE REM PT RTRN 9FT ADLT (ELECTROSURGICAL) ×1 IMPLANT
FACESHIELD WRAPAROUND (MASK) ×15 IMPLANT
GLOVE BIOGEL PI IND STRL 7.5 (GLOVE) ×1 IMPLANT
GLOVE BIOGEL PI IND STRL 8.5 (GLOVE) ×1 IMPLANT
GLOVE BIOGEL PI INDICATOR 7.5 (GLOVE) ×2
GLOVE BIOGEL PI INDICATOR 8.5 (GLOVE) ×2
GLOVE ECLIPSE 8.0 STRL XLNG CF (GLOVE) ×3 IMPLANT
GLOVE ORTHO TXT STRL SZ7.5 (GLOVE) ×6 IMPLANT
GOWN SPEC L3 XXLG W/TWL (GOWN DISPOSABLE) ×3 IMPLANT
GOWN STRL REUS W/TWL LRG LVL3 (GOWN DISPOSABLE) ×3 IMPLANT
HANDPIECE INTERPULSE COAX TIP (DISPOSABLE) ×2
KIT BASIN OR (CUSTOM PROCEDURE TRAY) ×3 IMPLANT
LIQUID BAND (GAUZE/BANDAGES/DRESSINGS) ×3 IMPLANT
MANIFOLD NEPTUNE II (INSTRUMENTS) ×3 IMPLANT
NDL SAFETY ECLIPSE 18X1.5 (NEEDLE) ×1 IMPLANT
NEEDLE HYPO 18GX1.5 SHARP (NEEDLE) ×2
PACK TOTAL JOINT (CUSTOM PROCEDURE TRAY) ×3 IMPLANT
PEN SKIN MARKING BROAD (MISCELLANEOUS) ×3 IMPLANT
POSITIONER SURGICAL ARM (MISCELLANEOUS) ×3 IMPLANT
SET HNDPC FAN SPRY TIP SCT (DISPOSABLE) ×1 IMPLANT
SET PAD KNEE POSITIONER (MISCELLANEOUS) ×3 IMPLANT
SUCTION FRAZIER 12FR DISP (SUCTIONS) ×3 IMPLANT
SUT MNCRL AB 4-0 PS2 18 (SUTURE) ×3 IMPLANT
SUT VIC AB 1 CT1 36 (SUTURE) ×3 IMPLANT
SUT VIC AB 2-0 CT1 27 (SUTURE) ×6
SUT VIC AB 2-0 CT1 TAPERPNT 27 (SUTURE) ×3 IMPLANT
SUT VLOC 180 0 24IN GS25 (SUTURE) ×3 IMPLANT
SYR 50ML LL SCALE MARK (SYRINGE) ×3 IMPLANT
TOWEL OR 17X26 10 PK STRL BLUE (TOWEL DISPOSABLE) ×3 IMPLANT
TOWEL OR NON WOVEN STRL DISP B (DISPOSABLE) IMPLANT
TRAY FOLEY W/METER SILVER 14FR (SET/KITS/TRAYS/PACK) ×3 IMPLANT
WATER STERILE IRR 1500ML POUR (IV SOLUTION) ×3 IMPLANT
WRAP KNEE MAXI GEL POST OP (GAUZE/BANDAGES/DRESSINGS) ×3 IMPLANT
YANKAUER SUCT BULB TIP 10FT TU (MISCELLANEOUS) ×3 IMPLANT

## 2015-01-20 NOTE — Progress Notes (Signed)
PHARMACIST - PHYSICIAN ORDER COMMUNICATION  CONCERNING: P&T Medication Policy on Herbal Medications  DESCRIPTION:  This patient's order for:  Biotin has been noted.  This product(s) is classified as an "herbal" or natural product. Due to a lack of definitive safety studies or FDA approval, nonstandard manufacturing practices, plus the potential risk of unknown drug-drug interactions while on inpatient medications, the Pharmacy and Therapeutics Committee does not permit the use of "herbal" or natural products of this type within [facility].   ACTION TAKEN: The pharmacy department is unable to verify this order at this time and your patient has been informed of this safety policy. Please reevaluate patient's clinical condition at discharge and address if the herbal or natural product(s) should be resumed at that time.  Royetta Asal, PharmD, BCPS 01/20/15 20:01

## 2015-01-20 NOTE — Anesthesia Procedure Notes (Signed)
Spinal Patient location during procedure: OR Start time: 01/20/2015 4:55 PM End time: 01/20/2015 5:05 PM Staffing Anesthesiologist: Lillia Abed Performed by: anesthesiologist  Preanesthetic Checklist Completed: patient identified, site marked, surgical consent, pre-op evaluation, timeout performed, IV checked, risks and benefits discussed and monitors and equipment checked Spinal Block Patient position: sitting Prep: Betadine Patient monitoring: heart rate, cardiac monitor, continuous pulse ox and blood pressure Approach: right paramedian Location: L4-5 Injection technique: single-shot Needle Needle type: Spinocan  Needle gauge: 22 G Needle length: 9 cm Needle insertion depth: 7 cm Assessment Sensory level: T10

## 2015-01-20 NOTE — Anesthesia Postprocedure Evaluation (Signed)
Anesthesia Post Note  Patient: [name]  Procedure(s) Performed: Procedure(s) (LRB): LEFT TOTAL KNEE ARTHROPLASTY (Left)  Anesthesia type: spinal  Patient location: PACU  Post pain: Pain level controlled  Post assessment: Patient's Cardiovascular Status Stable  Last Vitals:  Filed Vitals:   01/20/15 1915  BP: 125/59  Pulse: 70  Temp:   Resp: 22    Post vital signs: Reviewed and stable  Level of consciousness: awake  Complications: No apparent anesthesia complications

## 2015-01-20 NOTE — Anesthesia Preprocedure Evaluation (Addendum)
Anesthesia Evaluation  Patient identified by MRN, date of birth, ID band Patient awake    Reviewed: Allergy & Precautions, NPO status , Patient's Chart, lab work & pertinent test results  Airway Mallampati: I  TM Distance: >3 FB Neck ROM: Full    Dental   Pulmonary    Pulmonary exam normal       Cardiovascular hypertension, Pt. on medications + Valvular Problems/Murmurs AS Rhythm:Regular + Systolic murmurs ECHO 69/6789 LV - Nl LV Function EF 65% Aortic Valve Mod Stenosis and Mild AI (Peak gradient 36 Mean 24 Valve area 1.14) NL PA pressures   Neuro/Psych    GI/Hepatic GERD-  Medicated and Controlled,  Endo/Other    Renal/GU      Musculoskeletal   Abdominal   Peds  Hematology   Anesthesia Other Findings   Reproductive/Obstetrics                            Anesthesia Physical Anesthesia Plan  ASA: III  Anesthesia Plan: Spinal   Post-op Pain Management:    Induction: Intravenous  Airway Management Planned: Natural Airway  Additional Equipment:   Intra-op Plan:   Post-operative Plan:   Informed Consent: I have reviewed the patients History and Physical, chart, labs and discussed the procedure including the risks, benefits and alternatives for the proposed anesthesia with the patient or authorized representative who has indicated his/her understanding and acceptance.     Plan Discussed with: CRNA and Surgeon  Anesthesia Plan Comments:         Anesthesia Quick Evaluation

## 2015-01-20 NOTE — Op Note (Signed)
NAME:  [name]                      MEDICAL RECORD NO.:  [id number]                             FACILITY:  [facility]      PHYSICIAN:  Pietro Cassis. Alvan Dame, M.D.  DATE OF BIRTH:  [birth year]      DATE OF PROCEDURE:  01/20/2015                                     OPERATIVE REPORT         PREOPERATIVE DIAGNOSIS:  Left knee osteoarthritis.      POSTOPERATIVE DIAGNOSIS:  Left knee osteoarthritis.      FINDINGS:  The patient was noted to have complete loss of cartilage and   bone-on-bone arthritis with associated osteophytes in the lateral and patellofemoral compartments of   the knee with a significant synovitis and associated effusion.      PROCEDURE:  Left total knee replacement.      COMPONENTS USED:  DePuy Sigma rotating platform posterior stabilized knee   system, a size 2.5 femur, 2.5 tibia, 12.5 mm PS insert, and 35 patellar   button.      SURGEON:  Pietro Cassis. Alvan Dame, M.D.      ASSISTANT:  Danae Orleans, PA-C.      ANESTHESIA:  Spinal.      SPECIMENS:  None.      COMPLICATION:  None.      DRAINS:  None.  EBL: <50cc      TOURNIQUET TIME:   Total Tourniquet Time Documented: Thigh (Left) - 30 minutes Total: Thigh (Left) - 30 minutes  .      The patient was stable to the recovery room.      INDICATION FOR PROCEDURE:  [name] is a 81 y.o. female patient of   mine.  The patient had been seen, evaluated, and treated conservatively in the   office with medication, activity modification, and injections.  The patient had   radiographic changes of bone-on-bone arthritis with endplate sclerosis and osteophytes noted.      The patient failed conservative measures including medication, injections, and activity modification, and at this point was ready for more definitive measures.   Based on the radiographic changes and failed conservative measures, the patient   decided to proceed with total knee replacement.  Risks of infection,   DVT, component failure, need for  revision surgery, postop course, and   expectations were all   discussed and reviewed.  Consent was obtained for benefit of pain   relief.      PROCEDURE IN DETAIL:  The patient was brought to the operative theater.   Once adequate anesthesia, preoperative antibiotics, 2 gm of Ancef, 1gm of Tranexamic Acid, and 10mg  of Decadron administered, the patient was positioned supine with the left thigh tourniquet placed.  The  left lower extremity was prepped and draped in sterile fashion.  A time-   out was performed identifying the patient, planned procedure, and   extremity.      The left lower extremity was placed in the [facility] leg holder.  The leg was   exsanguinated, tourniquet elevated to 250 mmHg.  A midline incision was   made followed by median parapatellar arthrotomy.  Following initial   exposure, attention was first directed to the patella.  Precut   measurement was noted to be 18 mm due to severe degenerative arthritic condition of patella.  I resected down to 14 mm and used a   35 patellar button to restore patellar height as well as cover the cut   surface.      The lug holes were drilled and a metal shim was placed to protect the   patella from retractors and saw blades.      At this point, attention was now directed to the femur.  The femoral   canal was opened with a drill, irrigated to try to prevent fat emboli.  An   intramedullary rod was passed at 3 degrees valgus, 10 mm of bone was   resected off the distal femur.  Following this resection, the tibia was   subluxated anteriorly.  Using the extramedullary guide, 2 mm of bone was resected off   the proximal lateral tibia.  We confirmed the gap would be   stable medially and laterally with a 10 mm insert as well as confirmed   the cut was perpendicular in the coronal plane, checking with an alignment rod.      Once this was done, I sized the femur to be a size 2.5 in the anterior-   posterior dimension, chose a standard  component based on medial and   lateral dimension.  The size 2.5 rotation block was then pinned in   position anterior referenced using the C-clamp to set rotation.  The   anterior, posterior, and  chamfer cuts were made without difficulty nor   notching making certain that I was along the anterior cortex to help   with flexion gap stability.      The final box cut was made off the lateral aspect of distal femur.      At this point, the tibia was sized to be a size 2.5, the size 2.5 tray was   then pinned in position through the medial third of the tubercle,   drilled, and keel punched.  Trial reduction was now carried with a 2.5 femur,  2.5 tibia, a 10 then 12.5 mm insert, and the 35 patella botton.  The knee was brought to   extension, full extension with good flexion stability with the patella   tracking through the trochlea without application of pressure.  Given   all these findings, the trial components removed.  Final components were   opened and cement was mixed.  The knee was irrigated with normal saline   solution and pulse lavage.  The synovial lining was   then injected with 30cc of 0.25% Marcaine with epinephrine and 1 cc of Toradol plus 30 cc of NS for a  total of 61 cc.      The knee was irrigated.  Final implants were then cemented onto clean and   dried cut surfaces of bone with the knee brought to extension with a 12.5 mm trial insert.      Once the cement had fully cured, the excess cement was removed   throughout the knee.  I confirmed I was satisfied with the range of   motion and stability, and the final 12.5 mm PS insert was chosen.  It was   placed into the knee.      The tourniquet had been let down at 30 minutes.  No significant   hemostasis required.  The   extensor  mechanism was then reapproximated using #1 Vicryl and #0 V-lock sutures with the knee   in flexion.  The   remaining wound was closed with 2-0 Vicryl and running 4-0 Monocryl.   The knee was  cleaned, dried, dressed sterilely using Dermabond and   Aquacel dressing.  The patient was then   brought to recovery room in stable condition, tolerating the procedure   well.   Please note that Physician Assistant, Danae Orleans, PA-C, was present for the entirety of the case, and was utilized for pre-operative positioning, peri-operative retractor management, general facilitation of the procedure.  He was also utilized for primary wound closure at the end of the case.              Pietro Cassis Alvan Dame, M.D.    01/20/2015 6:14 PM

## 2015-01-20 NOTE — Interval H&P Note (Signed)
History and Physical Interval Note:  01/20/2015 3:33 PM  [name]  has presented today for surgery, with the diagnosis of left knee osteoarthritis  The various methods of treatment have been discussed with the patient and family. After consideration of risks, benefits and other options for treatment, the patient has consented to  Procedure(s): LEFT TOTAL KNEE ARTHROPLASTY (Left) as a surgical intervention .  The patient's history has been reviewed, patient examined, no change in status, stable for surgery.  I have reviewed the patient's chart and labs.  Questions were answered to the patient's satisfaction.     Mauri Pole

## 2015-01-20 NOTE — Transfer of Care (Signed)
Immediate Anesthesia Transfer of Care Note  Patient: [name]  Procedure(s) Performed: Procedure(s): LEFT TOTAL KNEE ARTHROPLASTY (Left)  Patient Location: PACU  Anesthesia Type:Spinal  Level of Consciousness:  sedated, patient cooperative and responds to stimulation  Airway & Oxygen Therapy:Patient Spontanous Breathing and Patient connected to face mask oxgen  Post-op Assessment:  Report given to PACU RN and Post -op Vital signs reviewed and stable  Post vital signs:  Reviewed and stable  Last Vitals:  Filed Vitals:   01/20/15 1414  BP: 128/52  Pulse: 92  Temp: 36.5 C  Resp: 16    Complications: No apparent anesthesia complications

## 2015-01-21 ENCOUNTER — Inpatient Hospital Stay (HOSPITAL_COMMUNITY): Payer: Medicare Other

## 2015-01-21 ENCOUNTER — Encounter (HOSPITAL_COMMUNITY): Payer: Self-pay | Admitting: Orthopedic Surgery

## 2015-01-21 LAB — BASIC METABOLIC PANEL
Anion gap: 9 (ref 5–15)
BUN: 18 mg/dL (ref 6–20)
CHLORIDE: 103 mmol/L (ref 101–111)
CO2: 26 mmol/L (ref 22–32)
Calcium: 9.1 mg/dL (ref 8.9–10.3)
Creatinine, Ser: 0.79 mg/dL (ref 0.44–1.00)
GFR calc non Af Amer: >60 mL/min (ref >60)
Glucose, Bld: 157 mg/dL — ABNORMAL HIGH (ref 65–99)
Potassium: 3.8 mmol/L (ref 3.5–5.1)
Sodium: 138 mmol/L (ref 135–145)

## 2015-01-21 LAB — CBC
HCT: 34.8 % — ABNORMAL LOW (ref 36.0–46.0)
HEMOGLOBIN: 11.4 g/dL — AB (ref 12.0–15.0)
MCH: 30.5 pg (ref 26.0–34.0)
MCHC: 32.8 g/dL (ref 30.0–36.0)
MCV: 93 fL (ref 78.0–100.0)
Platelets: 202 10*3/uL (ref 150–400)
RBC: 3.74 MIL/uL — ABNORMAL LOW (ref 3.87–5.11)
RDW: 13.6 % (ref 11.5–15.5)
WBC: 8 10*3/uL (ref 4.0–10.5)

## 2015-01-21 NOTE — Progress Notes (Signed)
OT Cancellation Note  Patient Details Name: RAIVYN KABLER MRN: [id number] DOB: 07/07/1925   Cancelled Treatment:    Noted plans for SNF. Will defer OT eval to SNF Thanks, [place], Thereasa Parkin 01/21/2015, 11:27 AM

## 2015-01-21 NOTE — Progress Notes (Signed)
     Subjective: 1 Day Post-Op Procedure(s) (LRB): LEFT TOTAL KNEE ARTHROPLASTY (Left)   Patient reports pain as mild, pain controlled. No events throughout the night. Planning on SNF upon discharge.   Objective:   VITALS:   Filed Vitals:   01/21/15 0520  BP: 104/54  Pulse: 72  Temp: 98.7 F (37.1 C)  Resp: 16    Dorsiflexion/Plantar flexion intact Incision: dressing C/D/I No cellulitis present Compartment soft  LABS  Recent Labs  01/21/15 0453  HGB 11.4*  HCT 34.8*  WBC 8.0  PLT 202     Recent Labs  01/21/15 0453  NA 138  K 3.8  BUN 18  CREATININE 0.79  GLUCOSE 157*     Assessment/Plan: 1 Day Post-Op Procedure(s) (LRB): LEFT TOTAL KNEE ARTHROPLASTY (Left) Foley cath d/c'ed Advance diet Up with therapy D/C IV fluids Discharge to SNF eventually, when ready  Overweight (BMI 25-29.9) Estimated body mass index is 27.82 kg/(m^2) as calculated from the following:   Height as of this encounter: 5\' 3"  (1.6 m).   Weight as of this encounter: 71.215 kg (157 lb). Patient also counseled that weight may inhibit the healing process Patient counseled that losing weight will help with future health issues      [name]. [name]   PAC  01/21/2015, 7:59 AM

## 2015-01-21 NOTE — Clinical Social Work Placement (Signed)
   CLINICAL SOCIAL WORK PLACEMENT  NOTE  Date:  01/21/2015  Patient Details  Name: [name] MRN: [id number] Date of Birth: May 06, 1925  Clinical Social Work is seeking post-discharge placement for this patient at the [place] level of care (*CSW will initial, date and re-position this form in  chart as items are completed):  No   Patient/family provided with [facility] Work Department's list of facilities offering this level of care within the geographic area requested by the patient (or if unable, by the patient's family).  Yes   Patient/family informed of their freedom to choose among providers that offer the needed level of care, that participate in Medicare, Medicaid or managed care program needed by the patient, have an available bed and are willing to accept the patient.  No   Patient/family informed of [facility]'s ownership interest in [facility] and [facility], as well as of the fact that they are under no obligation to receive care at these facilities.  PASRR submitted to EDS on       PASRR number received on       Existing PASRR number confirmed on       FL2 transmitted to all facilities in geographic area requested by pt/family on 01/21/15     FL2 transmitted to all facilities within larger geographic area on       Patient informed that his/her managed care company has contracts with or will negotiate with certain facilities, including the following:        Yes   Patient/family informed of bed offers received.  Patient chooses bed at [facility]     Physician recommends and patient chooses bed at      Patient to be transferred to [facility] on  .  Patient to be transferred to facility by       Patient family notified on   of transfer.  Name of family member notified:        PHYSICIAN       Additional Comment:     _______________________________________________ Luretha Rued, LCSW 01/21/2015, 3:18 PM

## 2015-01-21 NOTE — Clinical Social Work Note (Signed)
Clinical Social Work Assessment  Patient Details  Name: ARLENNE KIMBLEY MRN: [id number] Date of Birth: [birth year]  Date of referral:  01/21/15               Reason for consult:  Facility Placement                Permission sought to share information with:    Permission granted to share information::     Name::        Agency::     Relationship::     Contact Information:     Housing/Transportation Living arrangements for the past 2 months:  Apartment Source of Information:  Patient Patient Interpreter Needed:  None Criminal Activity/Legal Involvement Pertinent to Current Situation/Hospitalization:  No - Comment as needed Significant Relationships:  Adult Children Lives with:  Self Do you feel safe going back to the place where you live?   (Rehab placement is needed.) Need for family participation in patient care:  No (Coment)  Care giving concerns:  Pt's care cannot be managed at home following hospital d/c.   Social Worker assessment / plan:  Pt hospitalized on 01/20/15 for pre planned left total knee arthroplasty. Pt has made prior arrangements to have ST Rehab at [facility] following hospital d/c. CSW has confirmed d/c plan with SNF. Expecting d/c 01/22/15.  Employment status:  Retired Forensic scientist:  Medicare PT Recommendations:  [place] / Referral to community resources:  [place]  Patient/Family's Response to care:  Pt feels rehab is needed.  Patient/Family's Understanding of and Emotional Response to Diagnosis, Current Treatment, and Prognosis:  Pt has been to [facility] in the past and is looking forward to placement there for rehab.   Emotional Assessment Appearance:  Appears stated age Attitude/Demeanor/Rapport:  Other (cooperative) Affect (typically observed):  Anxious Orientation:  Oriented to Self, Oriented to Place, Oriented to  Time Alcohol / Substance use:  Not Applicable Psych involvement (Current and  /or in the community):  No (Comment)  Discharge Needs  Concerns to be addressed:  Discharge Planning Concerns Readmission within the last 30 days:  No Current discharge risk:  None Barriers to Discharge:  No Barriers Identified   Kindel Rochefort, Randall An, LCSW 01/21/2015, 3:12 PM

## 2015-01-21 NOTE — Evaluation (Signed)
Physical Therapy Evaluation Patient Details Name: [name] MRN: [id number] DOB: 09-20-1924 Today's Date: 01/21/2015   History of Present Illness  81 yo female s/p L TKA 01/20/15. Hx of spinal stenosis, back pain, shoulder surgery  Clinical Impression  On eval, pt required Mod assist (+2 for safety) for mobility-able to walk ~7 feet with RW. Limited by pain. Recommend ST rehab at SNF.    Follow Up Recommendations SNF    Equipment Recommendations  None recommended by PT    Recommendations for Other Services       Precautions / Restrictions Precautions Precautions: Fall Restrictions Weight Bearing Restrictions: No LLE Weight Bearing: Weight bearing as tolerated      Mobility  Bed Mobility               General bed mobility comments: pt oob in recliner  Transfers Overall transfer level: Needs assistance Equipment used: Rolling walker (2 wheeled) Transfers: Sit to/from Stand Sit to Stand: Mod assist         General transfer comment: Assist to rise, stabilize, control descent. VC safety, technique, hand placement. WEight shifted posteriorly initially so increased time needed to gain balance  Ambulation/Gait Ambulation/Gait assistance: Min assist;+2 safety/equipment Ambulation Distance (Feet): 7 Feet   Gait Pattern/deviations: Step-to pattern;Antalgic;Trunk flexed     General Gait Details: +2 for chair follow. VCs safety, technique, sequence. slow gait speed. distance limited by pain.   Stairs            Wheelchair Mobility    Modified Rankin (Stroke Patients Only)       Balance                                             Pertinent Vitals/Pain Pain Assessment: Faces Faces Pain Scale: Hurts whole lot Pain Location: L LE with activity Pain Descriptors / Indicators: Sore;Aching Pain Intervention(s): Monitored during session;Ice applied;Repositioned    Home Living Family/patient expects to be discharged to:: Skilled  nursing facility                      Prior Function                 Hand Dominance        Extremity/Trunk Assessment   Upper Extremity Assessment: Defer to OT evaluation           Lower Extremity Assessment: LLE deficits/detail   LLE Deficits / Details: hip flex 3-/5, moves ankle well  Cervical / Trunk Assessment: Kyphotic  Communication   Communication: No difficulties  Cognition Arousal/Alertness: Awake/alert Behavior During Therapy: WFL for tasks assessed/performed Overall Cognitive Status: Within Functional Limits for tasks assessed                      General Comments      Exercises Total Joint Exercises Ankle Circles/Pumps: AROM;Both;10 reps;Supine Quad Sets: AROM;Both;10 reps;Supine Hip ABduction/ADduction: AAROM;Left;10 reps;Supine Straight Leg Raises: AAROM;Left;10 reps;Supine Knee Flexion: AAROM;Left;10 reps;Seated Goniometric ROM: ~10-60 degrees      Assessment/Plan    PT Assessment Patient needs continued PT services  PT Diagnosis Difficulty walking;Acute pain   PT Problem List Decreased strength;Decreased range of motion;Decreased activity tolerance;Decreased balance;Decreased mobility;Decreased knowledge of use of DME;Pain  PT Treatment Interventions DME instruction;Gait training;Functional mobility training;Therapeutic activities;Therapeutic exercise;Patient/family education;Balance training   PT Goals (Current goals can be found in  the Care Plan section) Acute Rehab PT Goals Patient Stated Goal: less pain.  PT Goal Formulation: With patient Time For Goal Achievement: 01/28/15 Potential to Achieve Goals: Good    Frequency 7X/week   Barriers to discharge        Co-evaluation               End of Session Equipment Utilized During Treatment: Gait belt Activity Tolerance: Patient limited by pain Patient left: in chair;with call bell/phone within reach           Time: 0926-0948 PT Time Calculation  (min) (ACUTE ONLY): 22 min   Charges:   PT Evaluation $Initial PT Evaluation Tier I: 1 Procedure     PT G Codes:        Weston Anna, MPT Pager: [phone]

## 2015-01-21 NOTE — Discharge Instructions (Signed)

## 2015-01-21 NOTE — Progress Notes (Signed)
Physical Therapy Treatment Patient Details Name: [name] MRN: [id number] DOB: November 11, 1924 Today's Date: 01/21/2015    History of Present Illness 81 yo female s/p L TKA 01/20/15. Hx of spinal stenosis, back pain, shoulder surgery    PT Comments    Progressing slowly with mobility. Limited by pain.   Follow Up Recommendations  SNF     Equipment Recommendations  None recommended by PT    Recommendations for Other Services       Precautions / Restrictions Precautions Precautions: Fall Restrictions Weight Bearing Restrictions: No LLE Weight Bearing: Weight bearing as tolerated    Mobility  Bed Mobility Overal bed mobility: Needs Assistance Bed Mobility: Sit to Supine       Sit to supine: Min assist   General bed mobility comments: Assist for LEs onto bed.   Transfers Overall transfer level: Needs assistance Equipment used: Rolling walker (2 wheeled) Transfers: Sit to/from [facility] Sit to Stand: Mod assist Stand pivot transfers: Min assist       General transfer comment: Assist to rise, stabilize, control descent. VC safety, technique, hand placement. WEight shifted posteriorly initially so increased time needed to gain balance  Ambulation/Gait Ambulation/Gait assistance: Min assist;+2 safety/equipment Ambulation Distance (Feet): 15 Feet Assistive device: Rolling walker (2 wheeled) Gait Pattern/deviations: Step-to pattern;Trunk flexed;Antalgic     General Gait Details: +2 for chair follow. VCs safety, technique, sequence. slow gait speed. distance limited by pain.    Stairs            Wheelchair Mobility    Modified Rankin (Stroke Patients Only)       Balance                                    Cognition Arousal/Alertness: Awake/alert Behavior During Therapy: WFL for tasks assessed/performed Overall Cognitive Status: Within Functional Limits for tasks assessed                      Exercises  Total Joint Exercises Ankle Circles/Pumps: AROM;Both;10 reps;Supine Quad Sets: AROM;Both;10 reps;Supine Hip ABduction/ADduction: AAROM;Left;10 reps;Supine Straight Leg Raises: AAROM;Left;10 reps;Supine Knee Flexion: AAROM;Left;10 reps;Seated Goniometric ROM: ~10-60 degrees    General Comments        Pertinent Vitals/Pain Pain Assessment: 0-10 Faces Pain Scale: Hurts even more Pain Location: L LE with activity (above and below the knee) Pain Descriptors / Indicators: Sore;Aching Pain Intervention(s): Monitored during session;Repositioned;Ice applied    Home Living Family/patient expects to be discharged to:: Skilled nursing facility                    Prior Function            PT Goals (current goals can now be found in the care plan section) Acute Rehab PT Goals Patient Stated Goal: less pain.  PT Goal Formulation: With patient Time For Goal Achievement: 01/28/15 Potential to Achieve Goals: Good Progress towards PT goals: Progressing toward goals    Frequency  7X/week    PT Plan Current plan remains appropriate    Co-evaluation             End of Session Equipment Utilized During Treatment: Gait belt Activity Tolerance: Patient limited by fatigue;Patient limited by pain Patient left: in bed;with call bell/phone within reach;with family/visitor present     Time: 4431-5400 PT Time Calculation (min) (ACUTE ONLY): 28 min  Charges:  $Gait Training: 8-22 mins $  Therapeutic Activity: 8-22 mins                    G Codes:      Weston Anna, MPT Pager: [phone]

## 2015-01-21 NOTE — Care Management Note (Signed)
Case Management Note  Patient Details  Name: [name] MRN: [id number] Date of Birth: [birth year]  Subjective/Objective:                   LEFT TOTAL KNEE ARTHROPLASTY (Left)  Action/Plan:  Discharge planning Expected Discharge Date:  01/21/15               Expected Discharge Plan:  [place]  In-House Referral:     Discharge planning Services  CM Consult  Post Acute Care Choice:    Choice offered to:     DME Arranged:    DME Agency:     HH Arranged:    [place] Agency:     Status of Service:  Completed, signed off  Medicare Important Message Given:    Date Medicare IM Given:    Medicare IM give by:    Date Additional Medicare IM Given:    Additional Medicare Important Message give by:     If discussed at [place] of Stay Meetings, dates discussed:    Additional Comments: CM notes pt to go to SNF; CSW arranging.  No other CM needs were communicated. Dellie Catholic, RN 01/21/2015, 12:05 PM

## 2015-01-22 LAB — BASIC METABOLIC PANEL
Anion gap: 7 (ref 5–15)
BUN: 16 mg/dL (ref 6–20)
CO2: 29 mmol/L (ref 22–32)
CREATININE: 0.68 mg/dL (ref 0.44–1.00)
Calcium: 9 mg/dL (ref 8.9–10.3)
Chloride: 104 mmol/L (ref 101–111)
GFR calc non Af Amer: >60 mL/min (ref >60)
GLUCOSE: 113 mg/dL — AB (ref 65–99)
Potassium: 3.6 mmol/L (ref 3.5–5.1)
Sodium: 140 mmol/L (ref 135–145)

## 2015-01-22 LAB — CBC
HCT: 31.4 % — ABNORMAL LOW (ref 36.0–46.0)
Hemoglobin: 10 g/dL — ABNORMAL LOW (ref 12.0–15.0)
MCH: 29.4 pg (ref 26.0–34.0)
MCHC: 31.8 g/dL (ref 30.0–36.0)
MCV: 92.4 fL (ref 78.0–100.0)
Platelets: 147 10*3/uL — ABNORMAL LOW (ref 150–400)
RBC: 3.4 MIL/uL — ABNORMAL LOW (ref 3.87–5.11)
RDW: 13.7 % (ref 11.5–15.5)
WBC: 6.4 10*3/uL (ref 4.0–10.5)

## 2015-01-22 MED ORDER — FERROUS SULFATE 325 (65 FE) MG PO TABS: 325.0000 mg | ORAL_TABLET | Freq: Three times a day (TID) | ORAL | Status: DC

## 2015-01-22 MED ORDER — HYDROCODONE-ACETAMINOPHEN 7.5-325 MG PO TABS: 1.0000 | ORAL_TABLET | ORAL | Status: DC | PRN

## 2015-01-22 MED ORDER — ASPIRIN 325 MG PO TBEC: 325.0000 mg | DELAYED_RELEASE_TABLET | Freq: Two times a day (BID) | ORAL | Status: AC

## 2015-01-22 MED ORDER — DIPHENHYDRAMINE HCL 25 MG PO CAPS: 25.0000 mg | ORAL_CAPSULE | Freq: Four times a day (QID) | ORAL | Status: DC | PRN

## 2015-01-22 MED ORDER — POLYETHYLENE GLYCOL 3350 17 G PO PACK: 17.0000 g | PACK | Freq: Two times a day (BID) | ORAL | Status: DC

## 2015-01-22 MED ORDER — DOCUSATE SODIUM 100 MG PO CAPS: 100.0000 mg | ORAL_CAPSULE | Freq: Two times a day (BID) | ORAL | Status: AC

## 2015-01-22 MED ORDER — TIZANIDINE HCL 4 MG PO TABS: 4.0000 mg | ORAL_TABLET | Freq: Four times a day (QID) | ORAL | Status: DC | PRN

## 2015-01-22 NOTE — Progress Notes (Signed)
Report called to nurse Anderson Malta at [facility] in [place], [place]

## 2015-01-22 NOTE — Progress Notes (Signed)
     Subjective: 2 Days Post-Op Procedure(s) (LRB): LEFT TOTAL KNEE ARTHROPLASTY (Left)   Patient reports pain as mild, in the left knee. She does complain of some left high pain, which would associate with the location of the tourniquet during the procedure. No events throughout the night.  Patient was pretty insistent that she would like to go to [facility] today.   Objective:   VITALS:   Filed Vitals:   01/21/15 2005  BP: 111/60  Pulse: 68  Temp: 98 F (36.7 C)  Resp: 16    Dorsiflexion/Plantar flexion intact Incision: dressing C/D/I No cellulitis present Compartment soft  LABS  Recent Labs  01/21/15 0453 01/22/15 0420  HGB 11.4* 10.0*  HCT 34.8* 31.4*  WBC 8.0 6.4  PLT 202 147*     Recent Labs  01/21/15 0453 01/22/15 0420  NA 138 140  K 3.8 3.6  BUN 18 16  CREATININE 0.79 0.68  GLUCOSE 157* 113*     Assessment/Plan: 2 Days Post-Op Procedure(s) (LRB): LEFT TOTAL KNEE ARTHROPLASTY (Left) Up with therapy Discharge to SNF  Follow up in 2 weeks at [facility]. Follow up with OLIN,Zalaya Astarita D in 2 weeks.  Contact information:  [facility] [address] [place] [phone]      Overweight (BMI 25-29.9) Estimated body mass index is 27.82 kg/(m^2) as calculated from the following:  Height as of this encounter: 5\' 3"  (1.6 m).  Weight as of this encounter: 71.215 kg (157 lb). Patient also counseled that weight may inhibit the healing process Patient counseled that losing weight will help with future health issues      West Pugh. Jakobie Henslee   PAC  01/22/2015, 8:53 AM

## 2015-01-22 NOTE — Clinical Social Work Placement (Signed)
   CLINICAL SOCIAL WORK PLACEMENT  NOTE  Date:  01/22/2015  Patient Details  Name: PRESLEE REGAS MRN: [id number] Date of Birth: 10-08-1924  Clinical Social Work is seeking post-discharge placement for this patient at the [place] level of care (*CSW will initial, date and re-position this form in  chart as items are completed):  No   Patient/family provided with [facility] Work Department's list of facilities offering this level of care within the geographic area requested by the patient (or if unable, by the patient's family).  Yes   Patient/family informed of their freedom to choose among providers that offer the needed level of care, that participate in Medicare, Medicaid or managed care program needed by the patient, have an available bed and are willing to accept the patient.  No   Patient/family informed of [facility]'s ownership interest in [facility] and [facility], as well as of the fact that they are under no obligation to receive care at these facilities.  PASRR submitted to EDS on       PASRR number received on       Existing PASRR number confirmed on       FL2 transmitted to all facilities in geographic area requested by pt/family on 01/21/15     FL2 transmitted to all facilities within larger geographic area on       Patient informed that his/her managed care company has contracts with or will negotiate with certain facilities, including the following:        Yes   Patient/family informed of bed offers received.  Patient chooses bed at [facility]     Physician recommends and patient chooses bed at      Patient to be transferred to [facility] on 01/22/15.  Patient to be transferred to facility by [facility]     Patient family notified on 01/22/15 of transfer.  Name of family member notified:  SON     PHYSICIAN       Additional Comment: Pt / son are in agreement with  d/c to SNF today. Pt is able to transport by car. NSG reviewed d/c summary, scripts, avs. Scripts included in d/c packet. D/c packet provided to pt prior to d/c.   _______________________________________________ Luretha Rued, LCSW 01/22/2015, 2:18 PM

## 2015-01-22 NOTE — Discharge Summary (Signed)
Physician Discharge Summary  Patient ID: [name] MRN: [id number] DOB/AGE: 1925/01/03 81 y.o.  Admit date: 01/20/2015 Discharge date:  01/22/2015  Procedures:  Procedure(s) (LRB): LEFT TOTAL KNEE ARTHROPLASTY (Left)  Attending Physician:  Dr. Paralee Cancel   Admission Diagnoses:   Left knee primary OA / pain  Discharge Diagnoses:  Principal Problem:   S/P left TKA Active Problems:   Overweight (BMI 25.0-29.9)   S/P knee replacement  Past Medical History  Diagnosis Date  . Spinal stenosis   . Spondylosis of lumbosacral region   . Scoliosis   . Shoulder pain   . Osteoarthritis     both knees  . Knee pain     bilateral  . Heart murmur   . Hypothyroidism   . Chronic pain   . Polyneuropathy in other diseases classified elsewhere 01/02/2014  . Difficulty swallowing     in the morning  . Glaucoma   . Hypertension     "not high- on medication for precaution"  . GERD (gastroesophageal reflux disease)     occasional  . H/O rotator cuff tear 10 years ago    right  . Equilibrium disorder   . Double vision     right eye  . Dry skin   . Constipation   . Urinary incontinence   . Cancer     hx skin cancer    HPI:    [name], 81 y.o. female, has a history of pain and functional disability in the left knee due to arthritis and has failed non-surgical conservative treatments for greater than 12 weeks to includeNSAID's and/or analgesics, corticosteriod injections, viscosupplementation injections, use of assistive devices and activity modification. Onset of symptoms was gradual, starting 1+ years ago with rapidlly worsening course since that time. The patient noted prior procedures on the knee to include arthroplasty on the right knee in June 2015 per Dr. Alvan Dame. Patient currently rates pain in the left knee(s) at 10 out of 10 with activity. Patient has worsening of pain with activity and weight bearing, pain that interferes with activities of daily living, pain with  passive range of motion, crepitus and joint swelling. Patient has evidence of periarticular osteophytes and joint space narrowing by imaging studies. There is no active infection. Risks, benefits and expectations were discussed with the patient. Risks including but not limited to the risk of anesthesia, blood clots, nerve damage, blood vessel damage, failure of the prosthesis, infection and up to and including death. Patient understand the risks, benefits and expectations and wishes to proceed with surgery.   PCP: Josem Kaufmann, MD   Discharged Condition: good  Hospital Course:  Patient underwent the above stated procedure on 01/20/2015. Patient tolerated the procedure well and brought to the recovery room in good condition and subsequently to the floor.  POD #1 BP: 104/54 ; Pulse: 72 ; Temp: 98.7 F (37.1 C) ; Resp: 16 Patient reports pain as mild, pain controlled. No events throughout the night. Dorsiflexion/plantar flexion intact, incision: dressing C/D/I, no cellulitis present and compartment soft.   LABS  Basename    HGB  11.4  HCT  34.8   POD #2  BP: 111/60 ; Pulse: 68 ; Temp: 98 F (36.7 C) ; Resp: 16 Patient reports pain as mild, in the left knee. She does complain of some left high pain, which would associate with the location of the tourniquet during the procedure. No events throughout the night. Patient was pretty insistent that she would like to go  to [facility] today.  Dorsiflexion/plantar flexion intact, incision: dressing C/D/I, no cellulitis present and compartment soft.   LABS  Basename    HGB  10.0  HCT  31.4    Discharge Exam: General appearance: alert, cooperative and no distress Extremities: Homans sign is negative, no sign of DVT, no edema, redness or tenderness in the calves or thighs and no ulcers, gangrene or trophic changes  Disposition:   Skilled nursing facility with follow up in 2 weeks   Follow-up Information    Follow up with Mauri Pole,  MD. Schedule an appointment as soon as possible for a visit in 2 weeks.   Specialty:  Orthopedic Surgery   Contact information:   [address] [place] [address] [phone]       Discharge Instructions    Call MD / Call 911    Complete by:  As directed   If you experience chest pain or shortness of breath, CALL 911 and be transported to the hospital emergency room.  If you develope a fever above 101 F, pus (white drainage) or increased drainage or redness at the wound, or calf pain, call your surgeon's office.     Change dressing    Complete by:  As directed   Maintain surgical dressing until follow up in the clinic. If the edges start to pull up, may reinforce with tape. If the dressing is no longer working, may remove and cover with gauze and tape, but must keep the area dry and clean.  Call with any questions or concerns.     Constipation Prevention    Complete by:  As directed   Drink plenty of fluids.  Prune juice may be helpful.  You may use a stool softener, such as Colace (over the counter) 100 mg twice a day.  Use MiraLax (over the counter) for constipation as needed.     Diet - low sodium heart healthy    Complete by:  As directed      Discharge instructions    Complete by:  As directed   Maintain surgical dressing until follow up in the clinic. If the edges start to pull up, may reinforce with tape. If the dressing is no longer working, may remove and cover with gauze and tape, but must keep the area dry and clean.  Follow up in 2 weeks at [facility]. Call with any questions or concerns.     Increase activity slowly as tolerated    Complete by:  As directed      TED hose    Complete by:  As directed   Use stockings (TED hose) for 2 weeks on both leg(s).  You may remove them at night for sleeping.     Weight bearing as tolerated    Complete by:  As directed   Laterality:  left  Extremity:  Lower             Medication List    STOP  taking these medications        EXCEDRIN EXTRA STRENGTH 250-250-65 MG per tablet  Generic drug:  aspirin-acetaminophen-caffeine     HYDROcodone-acetaminophen 5-325 MG per tablet  Commonly known as:  NORCO/VICODIN  Replaced by:  HYDROcodone-acetaminophen 7.5-325 MG per tablet     meloxicam 15 MG tablet  Commonly known as:  MOBIC      TAKE these medications        aspirin 325 MG EC tablet  Take 1 tablet (325 mg total)  by mouth 2 (two) times daily.     Biotin 5 MG Caps  Take 1 capsule by mouth every morning.     brinzolamide 1 % ophthalmic suspension  Commonly known as:  AZOPT  Place 1 drop into both eyes 2 (two) times daily.     calcium carbonate 600 MG Tabs tablet  Commonly known as:  OS-CAL  Take 600 mg by mouth every morning.     diphenhydrAMINE 25 mg capsule  Commonly known as:  BENADRYL  Take 1 capsule (25 mg total) by mouth every 6 (six) hours as needed for itching, allergies or sleep.     docusate sodium 100 MG capsule  Commonly known as:  COLACE  Take 1 capsule (100 mg total) by mouth 2 (two) times daily.     ferrous sulfate 325 (65 FE) MG tablet  Take 1 tablet (325 mg total) by mouth 3 (three) times daily after meals.     Fish Oil 1000 MG Caps  Take 1,000 mg by mouth daily.     gabapentin 100 MG capsule  Commonly known as:  NEURONTIN  TAKE 1 CAPSULE BY MOUTH THREE TIMES DAILY     Garlic 546 MG Tabs  Take 1,000 mg by mouth every morning.     hydrochlorothiazide 25 MG tablet  Commonly known as:  HYDRODIURIL  Take 25 mg by mouth every morning.     HYDROcodone-acetaminophen 7.5-325 MG per tablet  Commonly known as:  NORCO  Take 1-2 tablets by mouth every 4 (four) hours as needed for moderate pain.     levothyroxine 75 MCG tablet  Commonly known as:  SYNTHROID, LEVOTHROID  Take 75 mcg by mouth daily before breakfast.     magnesium gluconate 500 MG tablet  Commonly known as:  MAGONATE  Take 500 mg by mouth every morning.     multivitamin with  minerals Tabs tablet  Take 1 tablet by mouth every morning.     omeprazole 20 MG capsule  Commonly known as:  PRILOSEC  Take 20 mg by mouth every morning.     polyethylene glycol packet  Commonly known as:  MIRALAX / GLYCOLAX  Take 17 g by mouth 2 (two) times daily.     pravastatin 40 MG tablet  Commonly known as:  PRAVACHOL  Take 40 mg by mouth at bedtime.     tiZANidine 4 MG tablet  Commonly known as:  ZANAFLEX  Take 1 tablet (4 mg total) by mouth every 6 (six) hours as needed for muscle spasms.     vitamin B-12 1000 MCG tablet  Commonly known as:  CYANOCOBALAMIN  Take 1,000 mcg by mouth daily.     vitamin C 500 MG tablet  Commonly known as:  ASCORBIC ACID  Take 500 mg by mouth every morning.         Signed: West Pugh. Jeremaih Klima   PA-C  01/22/2015, 9:11 AM

## 2015-01-22 NOTE — Progress Notes (Signed)
Patient, [name] verbalized the request to get out of bed alone. Informed patient to call for assistance to get out of bed for bedside commode usage. Patient stated that she needs to be independent with her care. Reinforced the importance of fall prevention. Patient did not accept plan of care.  Placed four side rails up.

## 2015-10-14 DEATH — deceased

## 2015-12-02 ENCOUNTER — Emergency Department (HOSPITAL_COMMUNITY): Payer: Medicare Other

## 2015-12-02 ENCOUNTER — Encounter (HOSPITAL_COMMUNITY): Payer: Self-pay

## 2015-12-02 ENCOUNTER — Inpatient Hospital Stay (HOSPITAL_COMMUNITY)
Admission: EM | Admit: 2015-12-02 | Discharge: 2015-12-03 | DRG: 641 | Disposition: A | Discharge status: Home Health | Payer: Medicare Other | Attending: Internal Medicine | Admitting: Internal Medicine

## 2015-12-02 DIAGNOSIS — Z85828 Personal history of other malignant neoplasm of skin: Secondary | ICD-10-CM

## 2015-12-02 DIAGNOSIS — M170 Bilateral primary osteoarthritis of knee: Secondary | ICD-10-CM | POA: Diagnosis present

## 2015-12-02 DIAGNOSIS — R05 Cough: Secondary | ICD-10-CM

## 2015-12-02 DIAGNOSIS — R739 Hyperglycemia, unspecified: Secondary | ICD-10-CM | POA: Diagnosis present

## 2015-12-02 DIAGNOSIS — E876 Hypokalemia: Principal | ICD-10-CM | POA: Diagnosis present

## 2015-12-02 DIAGNOSIS — M79606 Pain in leg, unspecified: Secondary | ICD-10-CM | POA: Insufficient documentation

## 2015-12-02 DIAGNOSIS — E785 Hyperlipidemia, unspecified: Secondary | ICD-10-CM | POA: Diagnosis present

## 2015-12-02 DIAGNOSIS — W19XXXA Unspecified fall, initial encounter: Secondary | ICD-10-CM | POA: Diagnosis present

## 2015-12-02 DIAGNOSIS — E039 Hypothyroidism, unspecified: Secondary | ICD-10-CM | POA: Diagnosis present

## 2015-12-02 DIAGNOSIS — S32512A Fracture of superior rim of left pubis, initial encounter for closed fracture: Secondary | ICD-10-CM | POA: Diagnosis present

## 2015-12-02 DIAGNOSIS — R531 Weakness: Secondary | ICD-10-CM | POA: Diagnosis not present

## 2015-12-02 DIAGNOSIS — I10 Essential (primary) hypertension: Secondary | ICD-10-CM | POA: Diagnosis present

## 2015-12-02 DIAGNOSIS — E871 Hypo-osmolality and hyponatremia: Secondary | ICD-10-CM

## 2015-12-02 DIAGNOSIS — H409 Unspecified glaucoma: Secondary | ICD-10-CM | POA: Diagnosis present

## 2015-12-02 DIAGNOSIS — J209 Acute bronchitis, unspecified: Secondary | ICD-10-CM | POA: Diagnosis not present

## 2015-12-02 DIAGNOSIS — I82402 Acute embolism and thrombosis of unspecified deep veins of left lower extremity: Secondary | ICD-10-CM | POA: Diagnosis present

## 2015-12-02 DIAGNOSIS — R059 Cough, unspecified: Secondary | ICD-10-CM

## 2015-12-02 DIAGNOSIS — R600 Localized edema: Secondary | ICD-10-CM

## 2015-12-02 LAB — URINALYSIS, ROUTINE W REFLEX MICROSCOPIC
BILIRUBIN URINE: NEGATIVE
Glucose, UA: NEGATIVE mg/dL
Hgb urine dipstick: NEGATIVE
KETONES UR: NEGATIVE mg/dL
NITRITE: NEGATIVE
PROTEIN: NEGATIVE mg/dL
SPECIFIC GRAVITY, URINE: 1.01 (ref 1.005–1.030)
pH: 7 (ref 5.0–8.0)

## 2015-12-02 LAB — URINE MICROSCOPIC-ADD ON: Bacteria, UA: NONE SEEN

## 2015-12-02 LAB — CBC WITH DIFFERENTIAL/PLATELET
BASOS PCT: 1 %
Basophils Absolute: 0 10*3/uL (ref 0.0–0.1)
EOS ABS: 0.1 10*3/uL (ref 0.0–0.7)
Eosinophils Relative: 3 %
HCT: 34.4 % — ABNORMAL LOW (ref 36.0–46.0)
Hemoglobin: 11.6 g/dL — ABNORMAL LOW (ref 12.0–15.0)
Lymphocytes Relative: 38 %
Lymphs Abs: 1.4 10*3/uL (ref 0.7–4.0)
MCH: 30.1 pg (ref 26.0–34.0)
MCHC: 33.7 g/dL (ref 30.0–36.0)
MCV: 89.1 fL (ref 78.0–100.0)
MONOS PCT: 13 %
Monocytes Absolute: 0.5 10*3/uL (ref 0.1–1.0)
NEUTROS PCT: 45 %
Neutro Abs: 1.8 10*3/uL (ref 1.7–7.7)
Platelets: 219 10*3/uL (ref 150–400)
RBC: 3.86 MIL/uL — ABNORMAL LOW (ref 3.87–5.11)
RDW: 13.5 % (ref 11.5–15.5)
WBC: 3.8 10*3/uL — ABNORMAL LOW (ref 4.0–10.5)

## 2015-12-02 LAB — BASIC METABOLIC PANEL
Anion gap: 10 (ref 5–15)
BUN: 16 mg/dL (ref 6–20)
CALCIUM: 9.4 mg/dL (ref 8.9–10.3)
CO2: 28 mmol/L (ref 22–32)
CREATININE: 0.83 mg/dL (ref 0.44–1.00)
Chloride: 95 mmol/L — ABNORMAL LOW (ref 101–111)
Glucose, Bld: 119 mg/dL — ABNORMAL HIGH (ref 65–99)
Potassium: 2.5 mmol/L — CL (ref 3.5–5.1)
SODIUM: 133 mmol/L — AB (ref 135–145)

## 2015-12-02 LAB — CK: Total CK: 141 U/L (ref 38–234)

## 2015-12-02 LAB — TSH: TSH: 3.376 u[IU]/mL (ref 0.350–4.500)

## 2015-12-02 MED ORDER — PANTOPRAZOLE SODIUM 40 MG PO TBEC
40.0000 mg | DELAYED_RELEASE_TABLET | Freq: Every day | ORAL | Status: DC
  Administered 2015-12-02 – 2015-12-03 (×2): 40 mg via ORAL
  Filled 2015-12-02 (×2): qty 1

## 2015-12-02 MED ORDER — POTASSIUM CHLORIDE IN NACL 20-0.9 MEQ/L-% IV SOLN: Freq: Once | INTRAVENOUS | Status: DC

## 2015-12-02 MED ORDER — BIOTIN 5 MG PO CAPS: 1.0000 | ORAL_CAPSULE | Freq: Every morning | ORAL | Status: DC

## 2015-12-02 MED ORDER — TRAMADOL HCL 50 MG PO TABS
50.0000 mg | ORAL_TABLET | Freq: Four times a day (QID) | ORAL | Status: DC | PRN
  Administered 2015-12-02: 50 mg via ORAL
  Filled 2015-12-02: qty 1

## 2015-12-02 MED ORDER — ACETAMINOPHEN 325 MG PO TABS
650.0000 mg | ORAL_TABLET | Freq: Four times a day (QID) | ORAL | Status: DC | PRN
  Filled 2015-12-02: qty 2

## 2015-12-02 MED ORDER — POTASSIUM CHLORIDE 10 MEQ/100ML IV SOLN
10.0000 meq | INTRAVENOUS | Status: AC
  Administered 2015-12-02 (×2): 10 meq via INTRAVENOUS
  Filled 2015-12-02 (×3): qty 100

## 2015-12-02 MED ORDER — VITAMIN B-12 1000 MCG PO TABS
1000.0000 ug | ORAL_TABLET | Freq: Every day | ORAL | Status: DC
  Administered 2015-12-02 – 2015-12-03 (×2): 1000 ug via ORAL
  Filled 2015-12-02 (×2): qty 1

## 2015-12-02 MED ORDER — CALCIUM CARBONATE 1250 (500 CA) MG PO TABS
1250.0000 mg | ORAL_TABLET | Freq: Every morning | ORAL | Status: DC
  Administered 2015-12-03: 1250 mg via ORAL
  Filled 2015-12-02: qty 1

## 2015-12-02 MED ORDER — ADULT MULTIVITAMIN W/MINERALS CH
1.0000 | ORAL_TABLET | Freq: Every morning | ORAL | Status: DC
  Administered 2015-12-03: 1 via ORAL
  Filled 2015-12-02: qty 1

## 2015-12-02 MED ORDER — MAGNESIUM OXIDE 400 (241.3 MG) MG PO TABS
400.0000 mg | ORAL_TABLET | Freq: Every day | ORAL | Status: DC
  Administered 2015-12-03: 400 mg via ORAL
  Filled 2015-12-02: qty 1

## 2015-12-02 MED ORDER — ONDANSETRON HCL 4 MG PO TABS: 4.0000 mg | ORAL_TABLET | Freq: Four times a day (QID) | ORAL | Status: DC | PRN

## 2015-12-02 MED ORDER — POTASSIUM CHLORIDE IN NACL 20-0.9 MEQ/L-% IV SOLN
Freq: Once | INTRAVENOUS | Status: AC
  Administered 2015-12-02: 14:00:00 via INTRAVENOUS
  Filled 2015-12-02: qty 1000

## 2015-12-02 MED ORDER — GABAPENTIN 100 MG PO CAPS
100.0000 mg | ORAL_CAPSULE | Freq: Three times a day (TID) | ORAL | Status: DC
  Administered 2015-12-02 – 2015-12-03 (×4): 100 mg via ORAL
  Filled 2015-12-02 (×4): qty 1

## 2015-12-02 MED ORDER — ALBUTEROL SULFATE (2.5 MG/3ML) 0.083% IN NEBU
2.5000 mg | INHALATION_SOLUTION | Freq: Four times a day (QID) | RESPIRATORY_TRACT | Status: DC
  Administered 2015-12-02: 2.5 mg via RESPIRATORY_TRACT
  Filled 2015-12-02: qty 3

## 2015-12-02 MED ORDER — MAGNESIUM GLUCONATE 500 MG PO TABS: 500.0000 mg | ORAL_TABLET | Freq: Every morning | ORAL | Status: DC

## 2015-12-02 MED ORDER — BRINZOLAMIDE 1 % OP SUSP
1.0000 [drp] | Freq: Two times a day (BID) | OPHTHALMIC | Status: DC
  Administered 2015-12-03: 1 [drp] via OPHTHALMIC
  Filled 2015-12-02: qty 10

## 2015-12-02 MED ORDER — DOCUSATE SODIUM 100 MG PO CAPS
200.0000 mg | ORAL_CAPSULE | Freq: Every day | ORAL | Status: DC
Start: 2015-12-02 — End: 2015-12-03
  Administered 2015-12-02: 200 mg via ORAL
  Filled 2015-12-02: qty 2

## 2015-12-02 MED ORDER — POTASSIUM CHLORIDE CRYS ER 20 MEQ PO TBCR
20.0000 meq | EXTENDED_RELEASE_TABLET | Freq: Once | ORAL | Status: AC
  Administered 2015-12-02: 20 meq via ORAL
  Filled 2015-12-02: qty 1

## 2015-12-02 MED ORDER — ENOXAPARIN SODIUM 40 MG/0.4ML ~~LOC~~ SOLN
40.0000 mg | SUBCUTANEOUS | Status: DC
  Administered 2015-12-02: 40 mg via SUBCUTANEOUS
  Filled 2015-12-02: qty 0.4

## 2015-12-02 MED ORDER — PRAVASTATIN SODIUM 40 MG PO TABS
40.0000 mg | ORAL_TABLET | Freq: Every day | ORAL | Status: DC
  Administered 2015-12-02: 40 mg via ORAL
  Filled 2015-12-02: qty 1

## 2015-12-02 MED ORDER — ALBUTEROL SULFATE (2.5 MG/3ML) 0.083% IN NEBU
2.5000 mg | INHALATION_SOLUTION | Freq: Four times a day (QID) | RESPIRATORY_TRACT | Status: DC
  Administered 2015-12-03 (×2): 2.5 mg via RESPIRATORY_TRACT
  Filled 2015-12-02 (×2): qty 3

## 2015-12-02 MED ORDER — ACETAMINOPHEN 650 MG RE SUPP: 650.0000 mg | Freq: Four times a day (QID) | RECTAL | Status: DC | PRN

## 2015-12-02 MED ORDER — ONDANSETRON HCL 4 MG/2ML IJ SOLN: 4.0000 mg | Freq: Four times a day (QID) | INTRAMUSCULAR | Status: DC | PRN

## 2015-12-02 MED ORDER — SODIUM CHLORIDE 0.9% FLUSH: 3.0000 mL | Freq: Two times a day (BID) | INTRAVENOUS | Status: DC

## 2015-12-02 MED ORDER — ENSURE ENLIVE PO LIQD
237.0000 mL | Freq: Two times a day (BID) | ORAL | Status: DC
  Administered 2015-12-03: 237 mL via ORAL

## 2015-12-02 MED ORDER — HYDROCODONE-HOMATROPINE 5-1.5 MG/5ML PO SYRP
5.0000 mL | ORAL_SOLUTION | ORAL | Status: DC | PRN
  Administered 2015-12-02 – 2015-12-03 (×4): 5 mL via ORAL
  Filled 2015-12-02 (×4): qty 5

## 2015-12-02 MED ORDER — POTASSIUM CHLORIDE 2 MEQ/ML IV SOLN
INTRAVENOUS | Status: AC
  Administered 2015-12-02: 22:00:00 via INTRAVENOUS
  Filled 2015-12-02: qty 1000

## 2015-12-02 MED ORDER — LEVOTHYROXINE SODIUM 75 MCG PO TABS
75.0000 ug | ORAL_TABLET | Freq: Every day | ORAL | Status: DC
  Administered 2015-12-03: 75 ug via ORAL
  Filled 2015-12-02: qty 1

## 2015-12-02 MED ORDER — POTASSIUM CHLORIDE CRYS ER 20 MEQ PO TBCR
40.0000 meq | EXTENDED_RELEASE_TABLET | Freq: Once | ORAL | Status: AC
  Administered 2015-12-02: 40 meq via ORAL
  Filled 2015-12-02: qty 2

## 2015-12-02 NOTE — ED Notes (Signed)
Pt says she has noticed she has been confused today.  Reports she writes down her medications as she takes them and her son noticed that she was confused about her medications.  Pt says she doesn't know what she has taken today.

## 2015-12-02 NOTE — ED Notes (Signed)
Pt taken to XR.  

## 2015-12-02 NOTE — ED Notes (Signed)
Pt reports congested cough since Tuesday.  Pt reports she fell recently and fractured her pelvis as well and c/o pain.

## 2015-12-02 NOTE — ED Provider Notes (Signed)
CSN: [id number]     Arrival date & time 12/02/15  1148 History  By signing my name below, I, [name], attest that this documentation has been prepared under the direction and in the presence of Carmin Muskrat, MD. Electronically Signed: Terressa Hood, ED Scribe. 12/02/2015. 12:35 PM.  Chief Complaint  Patient presents with  . Cough   The history is provided by the patient and a relative (son). No language interpreter was used.   PCP: Josem Kaufmann, MD HPI Comments: [name] is a 82 y.o. female, with PMHx noted below,  who presents to the Emergency Department, accompanied by her son, complaining of increased confusion onset today. Associated Sx include auditory and visual hallucinations onset this morning. Son reports pt had cold like Sx including cough and general malaise approximately one week ago whereby pt was seen by an Urgent Care and Dx with an UTI. Thereafter, pt suffered a fall and was seen at [facility] for the fall. The son reports the exam at [facility] showed a left pelvic hairline fracture, but, was otherwise unremarkable. During exam, pt denies left hip pain.   Past Medical History  Diagnosis Date  . Spinal stenosis   . Spondylosis of lumbosacral region   . Scoliosis   . Shoulder pain   . Osteoarthritis     both knees  . Knee pain     bilateral  . Heart murmur   . Hypothyroidism   . Chronic pain   . Polyneuropathy in other diseases classified elsewhere ([place]) 01/02/2014  . Difficulty swallowing     in the morning  . Glaucoma   . Hypertension     "not high- on medication for precaution"  . GERD (gastroesophageal reflux disease)     occasional  . H/O rotator cuff tear 10 years ago    right  . Equilibrium disorder   . Double vision     right eye  . Dry skin   . Constipation   . Urinary incontinence   . Cancer ([place])     hx skin cancer   Past Surgical History  Procedure Laterality Date  . Cataract extraction      bilateral  . Dilation and curettage of  uterus    . Esophageal dilation  ~2012  . Shoulder surgery Left     "rebuilt"  . Total abdominal hysterectomy  1991    with bladder tac  . Total knee arthroplasty Right 01/21/2014    Procedure: RIGHT TOTAL KNEE ARTHROPLASTY;  Surgeon: Mauri Pole, MD;  Location: [facility] ORS;  Service: Orthopedics;  Laterality: Right;  . Inguinal hernia repair Bilateral     2011/2015  . Total knee arthroplasty Left 01/20/2015    Procedure: LEFT TOTAL KNEE ARTHROPLASTY;  Surgeon: Paralee Cancel, MD;  Location: [facility] ORS;  Service: Orthopedics;  Laterality: Left;   Family History  Problem Relation Age of Onset  . CVA Mother   . CVA Father   . Cancer Brother    Social History  Substance Use Topics  . Smoking status: Never Smoker   . Smokeless tobacco: Never Used  . Alcohol Use: No   OB History    No data available     Review of Systems  Constitutional:       Per HPI, otherwise negative  HENT:       Per HPI, otherwise negative  Respiratory:       Per HPI, otherwise negative  Cardiovascular:       Per HPI, otherwise negative  Gastrointestinal: Negative for vomiting.  Endocrine:       Negative aside from HPI  Genitourinary:       Neg aside from HPI   Musculoskeletal:       Per HPI, otherwise negative  Skin: Negative.   Neurological: Negative for syncope.      Allergies  Review of patient's allergies indicates no known allergies.  Home Medications   Prior to Admission medications   Medication Sig Start Date End Date Taking? Authorizing Provider  Biotin 5 MG CAPS Take 1 capsule by mouth every morning.    Historical Provider, MD  brinzolamide (AZOPT) 1 % ophthalmic suspension Place 1 drop into both eyes 2 (two) times daily.     Historical Provider, MD  calcium carbonate (OS-CAL) 600 MG TABS tablet Take 600 mg by mouth every morning.    Historical Provider, MD  diphenhydrAMINE (BENADRYL) 25 mg capsule Take 1 capsule (25 mg total) by mouth every 6 (six) hours as needed for itching, allergies or  sleep. 01/22/15   Danae Orleans, PA-C  docusate sodium (COLACE) 100 MG capsule Take 1 capsule (100 mg total) by mouth 2 (two) times daily. 01/22/15   Danae Orleans, PA-C  ferrous sulfate 325 (65 FE) MG tablet Take 1 tablet (325 mg total) by mouth 3 (three) times daily after meals. 01/22/15   Danae Orleans, PA-C  gabapentin (NEURONTIN) 100 MG capsule TAKE 1 CAPSULE BY MOUTH THREE TIMES DAILY 06/30/14   Kathrynn Ducking, MD  Garlic 123XX123 MG TABS Take 1,000 mg by mouth every morning.     Historical Provider, MD  hydrochlorothiazide (HYDRODIURIL) 25 MG tablet Take 25 mg by mouth every morning.     Historical Provider, MD  HYDROcodone-acetaminophen (NORCO) 7.5-325 MG per tablet Take 1-2 tablets by mouth every 4 (four) hours as needed for moderate pain. 01/22/15   Danae Orleans, PA-C  levothyroxine (SYNTHROID, LEVOTHROID) 75 MCG tablet Take 75 mcg by mouth daily before breakfast.    Historical Provider, MD  magnesium gluconate (MAGONATE) 500 MG tablet Take 500 mg by mouth every morning.    Historical Provider, MD  Multiple Vitamin (MULTIVITAMIN WITH MINERALS) TABS tablet Take 1 tablet by mouth every morning.     Historical Provider, MD  Omega-3 Fatty Acids (FISH OIL) 1000 MG CAPS Take 1,000 mg by mouth daily.    Historical Provider, MD  omeprazole (PRILOSEC) 20 MG capsule Take 20 mg by mouth every morning.     Historical Provider, MD  polyethylene glycol (MIRALAX / GLYCOLAX) packet Take 17 g by mouth 2 (two) times daily. 01/22/15   Danae Orleans, PA-C  pravastatin (PRAVACHOL) 40 MG tablet Take 40 mg by mouth at bedtime.     Historical Provider, MD  tiZANidine (ZANAFLEX) 4 MG tablet Take 1 tablet (4 mg total) by mouth every 6 (six) hours as needed for muscle spasms. 01/22/15   Danae Orleans, PA-C  vitamin B-12 (CYANOCOBALAMIN) 1000 MCG tablet Take 1,000 mcg by mouth daily.    Historical Provider, MD  vitamin C (ASCORBIC ACID) 500 MG tablet Take 500 mg by mouth every morning.     Historical Provider, MD   Triage  Vitals: BP 117/59 mmHg  Pulse 84  Temp(Src) 98 F (36.7 C) (Oral)  Resp 24  Ht 5\' 3"  (1.6 m)  Wt 152 lb (68.947 kg)  BMI 26.93 kg/m2  SpO2 99% Physical Exam  Constitutional: She appears well-developed and well-nourished. No distress.  HENT:  Head: Normocephalic and atraumatic.  Resolving ecchymosis on left temporal  Eyes: Conjunctivae and EOM are normal.  Cardiovascular: Normal rate and regular rhythm.   Pulmonary/Chest: Effort normal and breath sounds normal. No stridor. No respiratory distress.  Abdominal: She exhibits no distension.  Musculoskeletal: She exhibits tenderness. She exhibits no edema.       Left hip: She exhibits tenderness.  Unable to lift left leg secondary to pain.   Neurological: She is alert. No cranial nerve deficit.  Skin: Skin is warm and dry.  Nursing note and vitals reviewed.   ED Course  Procedures (including critical care time) DIAGNOSTIC STUDIES: Oxygen Saturation is 99% on ra, nl by my interpretation.    COORDINATION OF CARE: 12:31 PM: Discussed treatment plan which includes imaging and labs with pt and son at bedside; they verbalize understanding and agrees with treatment plan.  Labs Review Labs Reviewed  CBC WITH DIFFERENTIAL/PLATELET - Abnormal; Notable for the following:    WBC 3.8 (*)    RBC 3.86 (*)    Hemoglobin 11.6 (*)    HCT 34.4 (*)    All other components within normal limits  BASIC METABOLIC PANEL - Abnormal; Notable for the following:    Sodium 133 (*)    Potassium 2.5 (*)    Chloride 95 (*)    Glucose, Bld 119 (*)    All other components within normal limits  URINALYSIS, ROUTINE W REFLEX MICROSCOPIC (NOT AT [facility]) - Abnormal; Notable for the following:    Leukocytes, UA TRACE (*)    All other components within normal limits  URINE MICROSCOPIC-ADD ON - Abnormal; Notable for the following:    Squamous Epithelial / LPF 0-5 (*)    All other components within normal limits    Imaging Review Dg Chest 1 View  12/02/2015   CLINICAL DATA:  Cough and recent fall 1 week ago EXAM: CHEST 1 VIEW COMPARISON:  03/23/2015 FINDINGS: Cardiac shadow is stable. The lungs are well aerated bilaterally. No acute bony abnormality is seen. IMPRESSION: No acute abnormality noted. Electronically Signed   By: Inez Catalina M.D.   On: 12/02/2015 13:37   Dg Hip Unilat With Pelvis 2-3 Views Left  12/02/2015  CLINICAL DATA:  Left hip pain x1 week, status post fall EXAM: DG HIP (WITH OR WITHOUT PELVIS) 2-3V LEFT COMPARISON:  None. FINDINGS: Nondisplaced fractures of the left inferior pubic ramus and left superior pubic ramus/parasymphyseal region. Bilateral hip joint spaces are preserved. Visualized bony pelvis appears intact. Degenerative changes of the lower lumbar spine. IMPRESSION: Nondisplaced left superior and inferior pubic rami fractures. Electronically Signed   By: Julian Hy M.D.   On: 12/02/2015 13:41   I have personally reviewed and evaluated these images and lab results as part of my medical decision-making.   EKG Interpretation   Date/Time:  Wednesday December 02 2015 12:05:31 EDT Ventricular Rate:  73 PR Interval:  154 QRS Duration: 89 QT Interval:  403 QTC Calculation: 444 R Axis:   -7 Text Interpretation:  Sinus rhythm Abnormal R-wave progression, early  transition Nonspecific repol abnormality, diffuse leads Baseline wander in  lead(s) V6 Sinus rhythm T wave abnormality Baseline wander Abnormal ekg  Confirmed by Carmin Muskrat  MD ([id number]) on 12/02/2015 12:48:49 PM     3:11 PM Patient remains weak in appearance. We reviewed all findings, including sacral fracture, hypokalemia. Patient has already received initial resuscitation with fluids, oral supplementation provided as well.  MDM   I personally performed the services described in this documentation, which was scribed in my presence. The recorded information has  been reviewed and is accurate.    Elderly female presents with persistent weakness, cough,  after a fall that occurred several days ago. Here, the patient is hemodynamically stable, but weak, generally ill in appearance. Evaluation was notable for demonstration of multiple pelvic fractures, and hypokalemia. Patient received both IV and oral supplementation. No substantial electro cardiogram abnormalities. However, given the patient's weakness, abnormal potassium, she was admitted for further evaluation and management.  Carmin Muskrat, MD 12/02/15 [phone]

## 2015-12-02 NOTE — ED Notes (Signed)
Pt had episode of incontinence, pt bed linens changed and diaper applied.

## 2015-12-02 NOTE — ED Notes (Signed)
CRITICAL VALUE ALERT  Critical value received:  Potassium 2.5  Date of notification:  12/02/15  Time of notification:  [id number]  Critical value read back:Yes.    Nurse who received alert:  Norm Salt, RN  MD notified (1st page):  Dr. Vanita Panda  Time of first page:  [phone]  MD notified (2nd page):  Time of second page:  Responding MD:  Dr. Vanita Panda  Time MD responded:  [phone]

## 2015-12-02 NOTE — H&P (Addendum)
History and Physical  [name] [id number] DOB: 03-11-1925 DOA: 12/02/2015   PCP: Josem Kaufmann, MD  Referring Physician: ED/ Dr. Vanita Panda Patient coming from: home alone Chief Complaint: Generalized weakness  HPI:  82 year old female who lives by herself with a medical history of hypertension, hypothyroidism, hyperlipidemia, spinal stenosis with chronic back pain presented with one-week history of increasing generalized weakness and four-day history of coughing. On 11/24/2015, the patient visited her PCP whom gave her a prescription for ciprofloxacin for presumptive UTI. Her chief complaint that time was urinary incontinence and weakness. She went to the store to get her prescription filled. The patient had a mechanical fall at the grocery store while getting her prescription filled. She was evaluated at the emergency department in [place]. She was told she had a pelvic fracture, and discharged in stable condition. The patient took the ciprofloxacin for 2-3 days. She quit taking it, because she stated that it made her urinary incontinence worse. The patient denies any shortness of breath, coughing, hemoptysis, nausea, vomiting, diarrhea, abdominal pain, dysuria, hematuria, dizziness, syncope. However, the patient has been complaining of a nonproductive cough since 11/28/2015. She is not particularly complaining of any worsening shortness of breath or chest pain, but she states that the cough is keeping her up and affecting her sleep. In addition, she has had decreased oral intake and worsening generalized weakness. She denies any hemoptysis, hematochezia, melena. At baseline, the patient uses a Rollator.  Evaluation in the emergency department revealed hypokalemia with potassium 2.5, sodium 133, WBC 3.8, hemoglobin 11.6. Chest x-ray was unremarkable. EKG shows nonspecific T-wave changes. Urinalysis was negative for pyuria. The patient was afebrile and hemodynamically stable  without hypoxemia. X-rays of her pelvis showed a nondisplaced superior and inferior pubic rami fractures. Assessment/Plan: Generalized weakness -Multifactorial including dehydration (poor po intake), pubic rami fractures, electrolyte abnormalities, and possible bronchitis -PT evaluation -1 L IV fluid -Check CPK -TSH -Serum B12 -Urinalysis negative for pyuria  Hyponatremia -Secondary to volume depletion -give 1L IVF -BMP in am -Hold HCTZ  Hypokalemia -Replete -Check magnesium -Continue home dose magnesium gluconate  Acute bronchitis -Respiratory viral panel -Bronchodilators -hycodan prn cough -Chest x-ray negative for infiltrates  Hypothyroidism -Check TSH -Continue home dose Synthroid  Hyperlipidemia -Continue statin   Hyperglycemia  -Hemoglobin A1c  Pubic rami fractures, nondisplaced -Pain control -PT evaluation -WBAT  Lower extremity pain and edema -duplex r/o DVT       Past Medical History  Diagnosis Date  . Spinal stenosis   . Spondylosis of lumbosacral region   . Scoliosis   . Shoulder pain   . Osteoarthritis     both knees  . Knee pain     bilateral  . Heart murmur   . Hypothyroidism   . Chronic pain   . Polyneuropathy in other diseases classified elsewhere ([place]) 01/02/2014  . Difficulty swallowing     in the morning  . Glaucoma   . Hypertension     "not high- on medication for precaution"  . GERD (gastroesophageal reflux disease)     occasional  . H/O rotator cuff tear 10 years ago    right  . Equilibrium disorder   . Double vision     right eye  . Dry skin   . Constipation   . Urinary incontinence   . Cancer ([place])     hx skin cancer   Past Surgical History  Procedure Laterality Date  . Cataract extraction  bilateral  . Dilation and curettage of uterus    . Esophageal dilation  ~2012  . Shoulder surgery Left     "rebuilt"  . Total abdominal hysterectomy  1991    with bladder tac  . Total knee arthroplasty Right  01/21/2014    Procedure: RIGHT TOTAL KNEE ARTHROPLASTY;  Surgeon: Mauri Pole, MD;  Location: [facility];  Service: Orthopedics;  Laterality: Right;  . Inguinal hernia repair Bilateral     2011/2015  . Total knee arthroplasty Left 01/20/2015    Procedure: LEFT TOTAL KNEE ARTHROPLASTY;  Surgeon: Paralee Cancel, MD;  Location: [facility];  Service: Orthopedics;  Laterality: Left;   Social History:  reports that she has never smoked. She has never used smokeless tobacco. She reports that she does not drink alcohol or use illicit drugs.   Family History  Problem Relation Age of Onset  . CVA Mother   . CVA Father   . Cancer Brother      No Known Allergies    Prior to Admission medications   Medication Sig Start Date End Date Taking? Authorizing Provider  aspirin-acetaminophen-caffeine (EXCEDRIN MIGRAINE) [phone] MG tablet Take 2 tablets by mouth 2 (two) times daily as needed (arthritic pain).   Yes Historical Provider, MD  Biotin 5 MG CAPS Take 1 capsule by mouth every morning.   Yes Historical Provider, MD  brinzolamide (AZOPT) 1 % ophthalmic suspension Place 1 drop into both eyes 2 (two) times daily.    Yes Historical Provider, MD  calcium carbonate (OS-CAL) 600 MG TABS tablet Take 600 mg by mouth every morning.   Yes Historical Provider, MD  docusate sodium (COLACE) 100 MG capsule Take 1 capsule (100 mg total) by mouth 2 (two) times daily. Patient taking differently: Take 200 mg by mouth at bedtime.  01/22/15  Yes Danae Orleans, PA-C  gabapentin (NEURONTIN) 100 MG capsule TAKE 1 CAPSULE BY MOUTH THREE TIMES DAILY 06/30/14  Yes Kathrynn Ducking, MD  Garlic [id number] MG TABS Take 1,000 mg by mouth every morning.    Yes Historical Provider, MD  hydrochlorothiazide (HYDRODIURIL) 25 MG tablet Take 25 mg by mouth every morning.    Yes Historical Provider, MD  HYDROcodone-acetaminophen (NORCO/VICODIN) 5-325 MG tablet Take 1 tablet by mouth every 6 (six) hours as needed for moderate pain.   Yes Historical  Provider, MD  levothyroxine (SYNTHROID, LEVOTHROID) 75 MCG tablet Take 75 mcg by mouth daily before breakfast.   Yes Historical Provider, MD  magnesium gluconate (MAGONATE) 500 MG tablet Take 500 mg by mouth every morning.   Yes Historical Provider, MD  meloxicam (MOBIC) 15 MG tablet Take 15 mg by mouth daily.   Yes Historical Provider, MD  Multiple Vitamin (MULTIVITAMIN WITH MINERALS) TABS tablet Take 1 tablet by mouth every morning.    Yes Historical Provider, MD  omeprazole (PRILOSEC) 20 MG capsule Take 20 mg by mouth every morning.    Yes Historical Provider, MD  polyethylene glycol powder (GLYCOLAX/MIRALAX) powder Take 17 g by mouth daily as needed. 11/05/15  Yes Historical Provider, MD  pravastatin (PRAVACHOL) 40 MG tablet Take 40 mg by mouth at bedtime.    Yes Historical Provider, MD  traMADol (ULTRAM) 50 MG tablet Take 1-2 tablets by mouth every 4 (four) hours as needed for moderate pain.  10/23/15  Yes Historical Provider, MD  vitamin B-12 (CYANOCOBALAMIN) 1000 MCG tablet Take 1,000 mcg by mouth daily.   Yes Historical Provider, MD    Review of Systems:  Constitutional:  No  weight loss, night sweats, Fevers, chills, Head&Eyes: No headache.  No vision loss.  No eye pain or scotoma ENT:  No Difficulty swallowing,Tooth/dental problems,Sore throat,  No ear ache, post nasal drip,  Cardio-vascular:  No chest pain, Orthopnea, PND, swelling in lower extremities,  dizziness, palpitations  GI:  No  abdominal pain, nausea, vomiting, diarrhea, loss of appetite, hematochezia, melena, Resp:  No shortness of breath with exertion or at rest.  No coughing up of blood .No wheezing.No chest wall deformity  Skin:  no rash or lesions.  GU:  no dysuria, change in color of urine, no urgency or frequency. No flank pain.  Musculoskeletal:  No joint pain or swelling. No decreased range of motion.  Psych:  No change in mood or affect. No depression or anxiety. Neurologic: No headache, no dysesthesia,  no focal weakness, no vision loss. No syncope  Physical Exam: Filed Vitals:   12/02/15 1340 12/02/15 1400 12/02/15 1430 12/02/15 1619  BP:  125/97 123/71 135/56  Pulse:  78 78 78  Temp: 98 F (36.7 C)   98.2 F (36.8 C)  TempSrc:    Oral  Resp:  16 12 20   Height:    5\' 3"  (1.6 m)  Weight:    65.862 kg (145 lb 3.2 oz)  SpO2:  93% 97% 98%   General:  A&O x 3, NAD, nontoxic, pleasant/cooperative Head/Eye: No conjunctival hemorrhage, no icterus, [place]/AT, No nystagmus ENT:  No icterus,  No thrush, good dentition, no pharyngeal exudate Neck:  No masses, no lymphadenpathy, no bruits CV:  RRR, no rub, no gallop, no S3 Lung:  Bibasilar rales without any wheezing. Good air movement. Abdomen: soft/NT, +BS, nondistended, no peritoneal signs Ext: No cyanosis, No rashes, No petechiae, No lymphangitis, 2+LE edema Neuro: CNII-XII intact, strength 4/5 in bilateral upper and lower extremities, no dysmetria  Labs on Admission:  Basic Metabolic Panel:  Recent Labs Lab 12/02/15 1223  NA 133*  K 2.5*  CL 95*  CO2 28  GLUCOSE 119*  BUN 16  CREATININE 0.83  CALCIUM 9.4   Liver Function Tests: No results for input(s): AST, ALT, ALKPHOS, BILITOT, PROT, ALBUMIN in the last 168 hours. No results for input(s): LIPASE, AMYLASE in the last 168 hours. No results for input(s): AMMONIA in the last 168 hours. CBC:  Recent Labs Lab 12/02/15 1223  WBC 3.8*  NEUTROABS 1.8  HGB 11.6*  HCT 34.4*  MCV 89.1  PLT 219   Cardiac Enzymes:  Recent Labs Lab 12/02/15 1223  CKTOTAL 141   BNP: Invalid input(s): POCBNP CBG: No results for input(s): GLUCAP in the last 168 hours.  Radiological Exams on Admission: Dg Chest 1 View  12/02/2015  CLINICAL DATA:  Cough and recent fall 1 week ago EXAM: CHEST 1 VIEW COMPARISON:  03/23/2015 FINDINGS: Cardiac shadow is stable. The lungs are well aerated bilaterally. No acute bony abnormality is seen. IMPRESSION: No acute abnormality noted. Electronically Signed    By: Inez Catalina M.D.   On: 12/02/2015 13:37   Dg Hip Unilat With Pelvis 2-3 Views Left  12/02/2015  CLINICAL DATA:  Left hip pain x1 week, status post fall EXAM: DG HIP (WITH OR WITHOUT PELVIS) 2-3V LEFT COMPARISON:  None. FINDINGS: Nondisplaced fractures of the left inferior pubic ramus and left superior pubic ramus/parasymphyseal region. Bilateral hip joint spaces are preserved. Visualized bony pelvis appears intact. Degenerative changes of the lower lumbar spine. IMPRESSION: Nondisplaced left superior and inferior pubic rami fractures. Electronically Signed   By: Bertis Ruddy  [place] Pink M.D.   On: 12/02/2015 13:41    EKG: Independently reviewed. Sinus rhythm, nonspecific T-wave changes    Time spent:60 minutes Code Status:   FULL Family Communication:  Son updated at bedside   Tamkia Temples, DO  [facility] Pager [phone]  If 7PM-7AM, please contact night-coverage [email or web address] Password [email or web address] 12/02/2015, 6:17 PM

## 2015-12-03 ENCOUNTER — Observation Stay (HOSPITAL_COMMUNITY): Payer: Medicare Other

## 2015-12-03 DIAGNOSIS — R531 Weakness: Secondary | ICD-10-CM

## 2015-12-03 DIAGNOSIS — E876 Hypokalemia: Secondary | ICD-10-CM | POA: Diagnosis not present

## 2015-12-03 DIAGNOSIS — R05 Cough: Secondary | ICD-10-CM | POA: Diagnosis not present

## 2015-12-03 LAB — COMPREHENSIVE METABOLIC PANEL
ALBUMIN: 3.4 g/dL — AB (ref 3.5–5.0)
ALK PHOS: 51 U/L (ref 38–126)
ALT: 16 U/L (ref 14–54)
ANION GAP: 9 (ref 5–15)
AST: 26 U/L (ref 15–41)
BUN: 12 mg/dL (ref 6–20)
CALCIUM: 9.3 mg/dL (ref 8.9–10.3)
CHLORIDE: 101 mmol/L (ref 101–111)
CO2: 28 mmol/L (ref 22–32)
Creatinine, Ser: 0.73 mg/dL (ref 0.44–1.00)
GFR calc non Af Amer: >60 mL/min (ref >60)
Glucose, Bld: 99 mg/dL (ref 65–99)
POTASSIUM: 3.6 mmol/L (ref 3.5–5.1)
SODIUM: 138 mmol/L (ref 135–145)
Total Bilirubin: 0.3 mg/dL (ref 0.3–1.2)
Total Protein: 6.4 g/dL — ABNORMAL LOW (ref 6.5–8.1)

## 2015-12-03 LAB — CBC
HEMATOCRIT: 36.6 % (ref 36.0–46.0)
HEMOGLOBIN: 12 g/dL (ref 12.0–15.0)
MCH: 30.3 pg (ref 26.0–34.0)
MCHC: 32.8 g/dL (ref 30.0–36.0)
MCV: 92.4 fL (ref 78.0–100.0)
Platelets: 219 10*3/uL (ref 150–400)
RBC: 3.96 MIL/uL (ref 3.87–5.11)
RDW: 14.1 % (ref 11.5–15.5)
WBC: 3.9 10*3/uL — ABNORMAL LOW (ref 4.0–10.5)

## 2015-12-03 LAB — MAGNESIUM: MAGNESIUM: 2.1 mg/dL (ref 1.7–2.4)

## 2015-12-03 LAB — VITAMIN B12: Vitamin B-12: 2705 pg/mL — ABNORMAL HIGH (ref 180–914)

## 2015-12-03 LAB — PHOSPHORUS: PHOSPHORUS: 3.2 mg/dL (ref 2.5–4.6)

## 2015-12-03 MED ORDER — APIXABAN 5 MG PO TABS: 5.0000 mg | ORAL_TABLET | Freq: Two times a day (BID) | ORAL | Status: DC

## 2015-12-03 MED ORDER — APIXABAN 5 MG PO TABS: 10.0000 mg | ORAL_TABLET | Freq: Two times a day (BID) | ORAL | Status: DC

## 2015-12-03 MED ORDER — APIXABAN 5 MG PO TABS
10.0000 mg | ORAL_TABLET | Freq: Two times a day (BID) | ORAL | Status: DC
  Filled 2015-12-03: qty 2

## 2015-12-03 NOTE — Progress Notes (Signed)
Pt called to desk requesting Exederin for pain.  Has Tylenol and Tramadol PRN ordered.  Pt stated she did not want to take those.  Paged Dr Jerilee Hoh to see if able to order [place].  Let pt know waiting to see if it is available to order, still does not wish to take Tylenol or Tramadol at this time.

## 2015-12-03 NOTE — Progress Notes (Signed)
Radiology called to report findings of [place] BLE listed below.  Dr Jerilee Hoh notified.    IMPRESSION: 1. Left lower extremity isolated calf DVT without extension to the popliteal vein or above the knee  2. Negative for right lower extremity DVT.  These results will be called to the ordering clinician or representative by the Radiologist Assistant, and communication documented in the PACS or zVision Dashboard.

## 2015-12-03 NOTE — Clinical Social Work Note (Signed)
CSW met with pt and pt's children, [place] and [place] following PT evaluation recommending SNF. Pt and family are aware of observation status and Medicare guidelines/criteria for SNF. Pt indicates she has not been hospitalized in past 30 days. CSW discussed private pay for SNF or ALF. Pt states she cannot afford this and will go home with home health. Family plans to check in on pt frequently as they live nearby. CM referred for home health needs. Will d/c home today.  Benay Pike, [place]

## 2015-12-03 NOTE — Care Management Obs Status (Signed)
[place] NOTIFICATION   Patient Details  Name: [name] MRN: [id number] Date of Birth: April 27, 1925   Medicare Observation Status Notification Given:  Yes    Sherald Barge, RN 12/03/2015, 1:14 PM

## 2015-12-03 NOTE — Progress Notes (Signed)
Pt IV and telemetry removed, tolerated well.  Reviewed discharge instructions with pt and son, answered questions at this time.

## 2015-12-03 NOTE — Evaluation (Signed)
Physical Therapy Evaluation Patient Details Name: [name] MRN: [id number] DOB: 1924/12/16 Today's Date: 12/03/2015   History of Present Illness  82 yo F admitted with cough x4days.  Dx: with UTI, and then fell at the grocery store pharmacy when going to pick up her prescriptions.  XR shows nondisplaced superior and inferior pelvic rami fx's. Pt with continued cough that is keeping her awake at night, and increased generalized weakness.  DX: acute bronchitis.  PMH: hypertension, hypothyroidism, hyperlipidemia, spinal stenosis with chronic back pain  Clinical Impression  Pt received in bed, and was marginally agreeable to PT evaluation.  Pt expressed multiple irritations about being hospitalized.  Pt required Mod A for supine <>sit, and sit<>stand transfer with elevated surface.  Pt was able to ambulate 67ft with RW and CGA, however gait distance limited by fatigue.  Pt normally lives alone, and uses a rollator for ambulation.  Due to pt's weakness, and decreased endurance, she is recommended for SNF to optimize strength, and endurance to return to PLOF.     Follow Up Recommendations SNF    Equipment Recommendations  None recommended by PT    Recommendations for Other Services       Precautions / Restrictions Precautions Precautions: Fall Precaution Comments: Pt had a fall just prior to admission where she was going to sit down on a chair at the pharmacy, and slid off with resultant pelvic fx.  Restrictions Weight Bearing Restrictions: No      Mobility  Bed Mobility Overal bed mobility: Needs Assistance Bed Mobility: Supine to Sit     Supine to sit: Min assist;Mod assist     General bed mobility comments: pt initially expresses that she wants to do it by herself, but then requests aid of PT.  Pt required assistance to scoot hips to the EOB via bed pad.   Transfers Overall transfer level: Needs assistance Equipment used: Rolling walker (2 wheeled) Transfers: Sit to/from  Stand Sit to Stand: Min assist;Mod assist         General transfer comment: bed height elevated after several unsuccessful attempts at standing.   Ambulation/Gait Ambulation/Gait assistance: Min guard Ambulation Distance (Feet): 15 Feet Assistive device: Rolling walker (2 wheeled) Gait Pattern/deviations: Step-to pattern;Antalgic     General Gait Details: Pt demonstrates increased lateral weight shifting with decreased B knee flexion during gait.  Pt fatigues quickly with short gait distance.   Stairs            Wheelchair Mobility    Modified Rankin (Stroke Patients Only)       Balance Overall balance assessment: Needs assistance Sitting-balance support: Single extremity supported Sitting balance-Leahy Scale: Fair     Standing balance support: Bilateral upper extremity supported Standing balance-Leahy Scale: Fair                               Pertinent Vitals/Pain Pain Assessment: Faces Faces Pain Scale: Hurts even more Pain Location: B hips/pelvis area, and HA Pain Descriptors / Indicators: Aching Pain Intervention(s): Patient requesting pain meds-RN notified    Home Living Family/patient expects to be discharged to:: Other (Comment) (retirment community apartment. ) Living Arrangements: Alone Available Help at Discharge: Family (son assists her at times. ) Type of Home: Apartment         Home Equipment: Walker - 4 wheels Additional Comments: Pt became irritated with PLOF questioning, will obtain more during future tx sessions.     Prior  Function Level of Independence: Independent with assistive device(s)   Gait / Transfers Assistance Needed: Pt ambulates with a rollator  ADL's / Homemaking Assistance Needed: Pt states she is independent with ADL's, IADL's, including driving and getting her own groceries.         Hand Dominance        Extremity/Trunk Assessment   Upper Extremity Assessment: RUE deficits/detail;LUE  deficits/detail RUE Deficits / Details: Limited shoulder flexion to ~90*     LUE Deficits / Details: Limited shoulder flexion to ~90* (after having surgery)   Lower Extremity Assessment: Overall WFL for tasks assessed         Communication   Communication: Other (comment) (Pt becomes very irritated with high pitched voices, prefers to be spoken to in low pitch)  Cognition Arousal/Alertness: Awake/alert Behavior During Therapy:  (irritated) Overall Cognitive Status: Within Functional Limits for tasks assessed                      General Comments      Exercises        Assessment/Plan    PT Assessment Patient needs continued PT services  PT Diagnosis Difficulty walking;Generalized weakness   PT Problem List Decreased strength;Decreased range of motion;Decreased activity tolerance;Decreased balance;Decreased mobility;Decreased knowledge of use of DME;Decreased safety awareness;Decreased knowledge of precautions;Cardiopulmonary status limiting activity;Obesity  PT Treatment Interventions Gait training;DME instruction;Functional mobility training;Therapeutic activities;Therapeutic exercise;Balance training;Patient/family education   PT Goals (Current goals can be found in the Care Plan section) Acute Rehab PT Goals Patient Stated Goal: Pt expressed that she wants to be independent.  PT Goal Formulation: With patient Time For Goal Achievement: 12/10/15 Potential to Achieve Goals: Fair    Frequency Min 3X/week   Barriers to discharge Decreased caregiver support      Co-evaluation               End of Session Equipment Utilized During Treatment: Gait belt Activity Tolerance: Patient limited by fatigue;Patient limited by pain Patient left: in chair;with call bell/phone within reach      Functional Assessment Tool Used: Clinical Judgement Functional Limitation: Mobility: Walking and moving around Mobility: Walking and Moving Around Current Status [id number]):  At least 40 percent but less than 60 percent impaired, limited or restricted Mobility: Walking and Moving Around Goal Status [phone]): At least 20 percent but less than 40 percent impaired, limited or restricted    Time: [id number] PT Time Calculation (min) (ACUTE ONLY): 38 min   Charges:   PT Evaluation $PT Eval Moderate Complexity: 1 Procedure PT Treatments $Gait Training: 8-22 mins $Therapeutic Activity: 8-22 mins   PT G Codes:   PT G-Codes **NOT FOR INPATIENT CLASS** Functional Assessment Tool Used: Clinical Judgement Functional Limitation: Mobility: Walking and moving around Mobility: Walking and Moving Around Current Status [id number]): At least 40 percent but less than 60 percent impaired, limited or restricted Mobility: Walking and Moving Around Goal Status [phone]): At least 20 percent but less than 40 percent impaired, limited or restricted    Tacy Learn, PT, DPT X: [id number]   12/03/2015, 12:46 PM

## 2015-12-03 NOTE — Care Management Note (Signed)
Case Management Note  Patient Details  Name: LUSILA MATSUO MRN: [id number] Date of Birth: [birth year]  Subjective/Objective:        Spoke with patient and family Son in Sports coach Son and Daughter again for discharge planning. Patient will be discharge to home tonight and will be going to her home at Senior [facility]artment building in [place]. Patient requested [place] and referral was placed with Vaughan Basta (on call) at [facility] . Services will start on Monday and family is agreeable to this plan.   Will follow up with [facility] tomorrow to see if services can be expedited.         Action/Plan: Home with Home Health and family support.   Expected Discharge Date:                  Expected Discharge Plan:     In-House Referral:     Discharge planning Services     Post Acute Care Choice:    Choice offered to:     DME Arranged:    DME Agency:     HH Arranged:    [place] Agency:     Status of Service:     Medicare Important Message Given:    Date Medicare IM Given:    Medicare IM give by:    Date Additional Medicare IM Given:    Additional Medicare Important Message give by:     If discussed at [place] of Stay Meetings, dates discussed:    Additional Comments:  Alvie Heidelberg, RN 12/03/2015, 6:15 PM

## 2015-12-03 NOTE — Discharge Summary (Addendum)
Physician Discharge Summary  [name] [id number] DOB: 1924-12-29 DOA: 12/02/2015  PCP: Josem Kaufmann, MD  Admit date: 12/02/2015 Discharge date: 12/03/2015  Time spent: 45 minutes  Recommendations for Outpatient Follow-up:  -We'll be discharged home today. -Home health services will be arranged prior to discharge.   Discharge Diagnoses:  Active Problems:   Hypokalemia   Generalized weakness   Acute bronchitis   Hyponatremia   Leg pain   Discharge Condition: Stable and improved  Filed Weights   12/02/15 1150 12/02/15 1619  Weight: 68.947 kg (152 lb) 65.862 kg (145 lb 3.2 oz)    History of present illness:  As per Dr. Carles Collet on 4/19: 82 year old female who lives by herself with a medical history of hypertension, hypothyroidism, hyperlipidemia, spinal stenosis with chronic back pain presented with one-week history of increasing generalized weakness and four-day history of coughing. On 11/24/2015, the patient visited her PCP whom gave her a prescription for ciprofloxacin for presumptive UTI. Her chief complaint that time was urinary incontinence and weakness. She went to the store to get her prescription filled. The patient had a mechanical fall at the grocery store while getting her prescription filled. She was evaluated at the emergency department in [place]. She was told she had a pelvic fracture, and discharged in stable condition. The patient took the ciprofloxacin for 2-3 days. She quit taking it, because she stated that it made her urinary incontinence worse. The patient denies any shortness of breath, coughing, hemoptysis, nausea, vomiting, diarrhea, abdominal pain, dysuria, hematuria, dizziness, syncope. However, the patient has been complaining of a nonproductive cough since 11/28/2015. She is not particularly complaining of any worsening shortness of breath or chest pain, but she states that the cough is keeping her up and affecting her sleep. In addition, she  has had decreased oral intake and worsening generalized weakness. She denies any hemoptysis, hematochezia, melena. At baseline, the patient uses a Rollator.  Evaluation in the emergency department revealed hypokalemia with potassium 2.5, sodium 133, WBC 3.8, hemoglobin 11.6. Chest x-ray was unremarkable. EKG shows nonspecific T-wave changes. Urinalysis was negative for pyuria. The patient was afebrile and hemodynamically stable without hypoxemia. X-rays of her pelvis showed a nondisplaced superior and inferior pubic rami fractures.  Hospital Course:   Generalized weakness -Likely multifactorial due to a combination of factors including hypokalemia, acute respiratory infection, pubic rami fractures. -Has been seen by PT who is recommending SNF stay, however she does not meet inpatient criteria and would have to private pay. Social work is currently having discussions with patient and her family members about how to proceed.  Left lower extremity DVT -Have discussed risk/benefit of anticoagulation with patient, daughter, son and son-in-law. -We have decided to use Eliquis and aim for 6 months of treatment.  Nondisplaced pubic rami fractures  -nonoperative management, weightbearing as tolerated, pain control.  Hypothyroidism -TSH within normal limits, continue home dose of Synthroid.  Procedures:  None   Consultations:  None  Discharge Instructions  Discharge Instructions    Diet - low sodium heart healthy    Complete by:  As directed      Increase activity slowly    Complete by:  As directed             Medication List    STOP taking these medications        hydrochlorothiazide 25 MG tablet  Commonly known as:  HYDRODIURIL     meloxicam 15 MG tablet  Commonly known as:  MOBIC  TAKE these medications        aspirin-acetaminophen-caffeine 250-250-65 MG tablet  Commonly known as:  EXCEDRIN MIGRAINE  Take 2 tablets by mouth 2 (two) times daily as needed (arthritic  pain).     Biotin 5 MG Caps  Take 1 capsule by mouth every morning.     brinzolamide 1 % ophthalmic suspension  Commonly known as:  AZOPT  Place 1 drop into both eyes 2 (two) times daily.     calcium carbonate 600 MG Tabs tablet  Commonly known as:  OS-CAL  Take 600 mg by mouth every morning.     docusate sodium 100 MG capsule  Commonly known as:  COLACE  Take 1 capsule (100 mg total) by mouth 2 (two) times daily.     gabapentin 100 MG capsule  Commonly known as:  NEURONTIN  TAKE 1 CAPSULE BY MOUTH THREE TIMES DAILY     Garlic 123XX123 MG Tabs  Take 1,000 mg by mouth every morning.     HYDROcodone-acetaminophen 5-325 MG tablet  Commonly known as:  NORCO/VICODIN  Take 1 tablet by mouth every 6 (six) hours as needed for moderate pain.     levothyroxine 75 MCG tablet  Commonly known as:  SYNTHROID, LEVOTHROID  Take 75 mcg by mouth daily before breakfast.     magnesium gluconate 500 MG tablet  Commonly known as:  MAGONATE  Take 500 mg by mouth every morning.     multivitamin with minerals Tabs tablet  Take 1 tablet by mouth every morning.     omeprazole 20 MG capsule  Commonly known as:  PRILOSEC  Take 20 mg by mouth every morning.     polyethylene glycol powder powder  Commonly known as:  GLYCOLAX/MIRALAX  Take 17 g by mouth daily as needed.     pravastatin 40 MG tablet  Commonly known as:  PRAVACHOL  Take 40 mg by mouth at bedtime.     traMADol 50 MG tablet  Commonly known as:  ULTRAM  Take 1-2 tablets by mouth every 4 (four) hours as needed for moderate pain.     vitamin B-12 1000 MCG tablet  Commonly known as:  CYANOCOBALAMIN  Take 1,000 mcg by mouth daily.       No Known Allergies     Follow-up Information    Follow up with [facility], MD. Schedule an appointment as soon as possible for a visit in 2 weeks.   Specialty:  Family Medicine   Contact information:   [address] [place] [address] [phone]        The results of significant  diagnostics from this hospitalization (including imaging, microbiology, ancillary and laboratory) are listed below for reference.    Significant Diagnostic Studies: Dg Chest 1 View  12/02/2015  CLINICAL DATA:  Cough and recent fall 1 week ago EXAM: CHEST 1 VIEW COMPARISON:  03/23/2015 FINDINGS: Cardiac shadow is stable. The lungs are well aerated bilaterally. No acute bony abnormality is seen. IMPRESSION: No acute abnormality noted. Electronically Signed   By: Inez Catalina M.D.   On: 12/02/2015 13:37   US Venous Img Lower Bilateral  12/03/2015  CLINICAL DATA:  Bilateral lower extremity pain EXAM: BILATERAL LOWER EXTREMITY VENOUS DOPPLER ULTRASOUND TECHNIQUE: Gray-scale sonography with compression, as well as color and duplex ultrasound, were performed to evaluate the deep venous system from the level of the common femoral vein through the popliteal and proximal calf veins. COMPARISON:  None FINDINGS: On the right, normal compressibility of the common  femoral, superficial femoral, and popliteal veins, as well as the proximal calf veins. No filling defects to suggest DVT on grayscale or color Doppler imaging. Doppler waveforms show normal direction of venous flow, normal respiratory phasicity and response to augmentation. Visualized segments of the saphenous venous system normal in caliber and compressibility. On the left, there is a thrombosed peroneal vein. There is incompletely occlusive noncompressible thrombus in posterior tibial and anterior tibial veins. Normal compressibility of common femoral, superficial femoral, and popliteal veins. IMPRESSION: 1. Left lower extremity isolated calf DVT without extension to the popliteal vein or above the knee 2. Negative for  right lower extremity DVT. These results will be called to the ordering clinician or representative by the Radiologist Assistant, and communication documented in the PACS or zVision Dashboard. Electronically Signed   By: Lucrezia Europe M.D.   On:  12/03/2015 16:06   Dg Hip Unilat With Pelvis 2-3 Views Left  12/02/2015  CLINICAL DATA:  Left hip pain x1 week, status post fall EXAM: DG HIP (WITH OR WITHOUT PELVIS) 2-3V LEFT COMPARISON:  None. FINDINGS: Nondisplaced fractures of the left inferior pubic ramus and left superior pubic ramus/parasymphyseal region. Bilateral hip joint spaces are preserved. Visualized bony pelvis appears intact. Degenerative changes of the lower lumbar spine. IMPRESSION: Nondisplaced left superior and inferior pubic rami fractures. Electronically Signed   By: Julian Hy M.D.   On: 12/02/2015 13:41    Microbiology: No results found for this or any previous visit (from the past 240 hour(s)).   Labs: Basic Metabolic Panel:  Recent Labs Lab 12/02/15 1223 12/03/15 0612  NA 133* 138  K 2.5* 3.6  CL 95* 101  CO2 28 28  GLUCOSE 119* 99  BUN 16 12  CREATININE 0.83 0.73  CALCIUM 9.4 9.3  MG  --  2.1  PHOS  --  3.2   Liver Function Tests:  Recent Labs Lab 12/03/15 0612  AST 26  ALT 16  ALKPHOS 51  BILITOT 0.3  PROT 6.4*  ALBUMIN 3.4*   No results for input(s): LIPASE, AMYLASE in the last 168 hours. No results for input(s): AMMONIA in the last 168 hours. CBC:  Recent Labs Lab 12/02/15 1223 12/03/15 0612  WBC 3.8* 3.9*  NEUTROABS 1.8  --   HGB 11.6* 12.0  HCT 34.4* 36.6  MCV 89.1 92.4  PLT 219 219   Cardiac Enzymes:  Recent Labs Lab 12/02/15 1223  CKTOTAL 141   BNP: BNP (last 3 results) No results for input(s): BNP in the last 8760 hours.  ProBNP (last 3 results) No results for input(s): PROBNP in the last 8760 hours.  CBG: No results for input(s): GLUCAP in the last 168 hours.     SignedLelon Frohlich  [facility] Pager: [phone] 12/03/2015, 4:18 PM

## 2015-12-03 NOTE — Progress Notes (Signed)
Initial Nutrition Assessment  DOCUMENTATION CODES:   Not applicable  INTERVENTION:  -Ensure Enlive BID. Each supplement provides 350 kcals and 20 grams of protein. -Continue to monitor nutritional needs.  NUTRITION DIAGNOSIS:   Inadequate oral intake related to poor appetite as evidenced by estimated needs.  GOAL:   Patient will meet greater than or equal to 90% of their needs  MONITOR:   PO intake, Supplement acceptance, Labs, Weight trends, Skin, I & O's  REASON FOR ASSESSMENT:   Malnutrition Screening Tool    ASSESSMENT:   82 year old female who lives by herself with a medical history of hypertension, hypothyroidism, hyperlipidemia, spinal stenosis with chronic back pain presented with one-week history of increasing generalized weakness and four-day history of coughing. On 11/24/2015, the patient visited her PCP whom gave her a prescription for ciprofloxacin for presumptive UTI. Her chief complaint that time was urinary incontinence and weakness. She went to the store to get her prescription filled. The patient had a mechanical fall at the grocery store while getting her prescription filled. She was evaluated at the emergency department in [place]. She was told she had a pelvic fracture, and discharged in stable condition. The patient took the ciprofloxacin for 2-3 days. She quit taking it, because she stated that it made her urinary incontinence worse. The patient denies any shortness of breath, coughing, hemoptysis, nausea, vomiting, diarrhea, abdominal pain, dysuria, hematuria, dizziness, syncope. However, the patient has been complaining of a nonproductive cough since 11/28/2015. She is not particularly complaining of any worsening shortness of breath or chest pain, but she states that the cough is keeping her up and affecting her sleep. In addition, she has had decreased oral intake and worsening generalized weakness. She denies any hemoptysis, hematochezia, melena. At  baseline, the patient uses a Rollator.  Pt seen for MST. Pt with BMI categorized as overweight. Pt with stable weight. Per chart, pt has weighed between 145-155 lbs for 2 years. Pt reports similar weight trends. Pt reports losing a few lbs d/t advanced age.  Pt would not open eyes at time of visit and communicated minimally. Pt was not eating at all 2 days PTA. Pt picked at food prior to that. Pt recalls eating maybe 1 sandwich if she felt like it or drinking an Ensure throughout the day. Pt reports poor appetite and denies N/V associated with eating. Suspect weight loss is d/t advanced age and normal aging process. Intern discussed importance of weight maintenance and drinking Ensure when appetite is poor. Pt doe not report difficulty chewing or swallowing. Pt is agreeable to receiving Ensure. Will continue to follow up and monitor nutritional changes.   NFPE: No fat depletion, no muscle depletion, no edema.   Labs reviewed. Meds reviewed; Calcium Carbonate 1250 mg, Mag-Ox 400 mg, MVI, B12 1000 mcg.  Diet Order:  Diet Heart Room service appropriate?: Yes; Fluid consistency:: Thin  Skin:  Reviewed, no issues  Last BM:  4/18  Height:   Ht Readings from Last 1 Encounters:  12/02/15 5\' 3"  (1.6 m)    Weight:   Wt Readings from Last 1 Encounters:  12/02/15 145 lb 3.2 oz (65.862 kg)    Ideal Body Weight:  52.3 kg  BMI:  Body mass index is 25.73 kg/(m^2).  Estimated Nutritional Needs:   Kcal:  1250-1450 kcals (19-21 kcals/kg)  Protein:  80-90 (1.4 g/kg)  Fluid:  1.2-1.4 L  EDUCATION NEEDS:   No education needs identified at this time  Geoffery Lyons, [place] Dietetic Intern  Pager [phone]

## 2015-12-03 NOTE — Care Management Obs Status (Signed)
[facility] NOTIFICATION   Patient Details  Name: [name] MRN: [id number] Date of Birth: November 05, 1924   Medicare Observation Status Notification Given:  Yes    Alvie Heidelberg, RN 12/03/2015, 12:23 PM

## 2015-12-03 NOTE — Progress Notes (Signed)
ANTICOAGULATION CONSULT NOTE - Initial Consult  Pharmacy Consult for Apixaban Indication: DVT  No Known Allergies  Patient Measurements: Height: 5\' 3"  (160 cm) Weight: 145 lb 3.2 oz (65.862 kg) IBW/kg (Calculated) : 52.4  Vital Signs:    Labs:  Recent Labs  12/02/15 1223 12/03/15 0612  HGB 11.6* 12.0  HCT 34.4* 36.6  PLT 219 219  CREATININE 0.83 0.73  CKTOTAL 141  --     Estimated Creatinine Clearance: 44.4 mL/min (by C-G formula based on Cr of 0.73).   Medical History: Past Medical History  Diagnosis Date  . Spinal stenosis   . Spondylosis of lumbosacral region   . Scoliosis   . Shoulder pain   . Osteoarthritis     both knees  . Knee pain     bilateral  . Heart murmur   . Hypothyroidism   . Chronic pain   . Polyneuropathy in other diseases classified elsewhere ([place]) 01/02/2014  . Difficulty swallowing     in the morning  . Glaucoma   . Hypertension     "not high- on medication for precaution"  . GERD (gastroesophageal reflux disease)     occasional  . H/O rotator cuff tear 10 years ago    right  . Equilibrium disorder   . Double vision     right eye  . Dry skin   . Constipation   . Urinary incontinence   . Cancer ([place])     hx skin cancer    Medications:  Prescriptions prior to admission  Medication Sig Dispense Refill Last Dose  . aspirin-acetaminophen-caffeine (EXCEDRIN MIGRAINE) 250-250-65 MG tablet Take 2 tablets by mouth 2 (two) times daily as needed (arthritic pain).   12/02/2015 at Unknown time  . Biotin 5 MG CAPS Take 1 capsule by mouth every morning.   12/01/2015 at Unknown time  . brinzolamide (AZOPT) 1 % ophthalmic suspension Place 1 drop into both eyes 2 (two) times daily.    12/01/2015 at Unknown time  . calcium carbonate (OS-CAL) 600 MG TABS tablet Take 600 mg by mouth every morning.   12/01/2015 at Unknown time  . docusate sodium (COLACE) 100 MG capsule Take 1 capsule (100 mg total) by mouth 2 (two) times daily. (Patient taking  differently: Take 200 mg by mouth at bedtime. ) 10 capsule 0 12/01/2015 at Unknown time  . gabapentin (NEURONTIN) 100 MG capsule TAKE 1 CAPSULE BY MOUTH THREE TIMES DAILY 90 capsule 3 12/01/2015 at Unknown time  . Garlic 123XX123 MG TABS Take 1,000 mg by mouth every morning.    12/01/2015 at Unknown time  . hydrochlorothiazide (HYDRODIURIL) 25 MG tablet Take 25 mg by mouth every morning.    12/01/2015 at Unknown time  . HYDROcodone-acetaminophen (NORCO/VICODIN) 5-325 MG tablet Take 1 tablet by mouth every 6 (six) hours as needed for moderate pain.   12/01/2015 at Unknown time  . levothyroxine (SYNTHROID, LEVOTHROID) 75 MCG tablet Take 75 mcg by mouth daily before breakfast.   12/01/2015 at Unknown time  . magnesium gluconate (MAGONATE) 500 MG tablet Take 500 mg by mouth every morning.   12/01/2015 at Unknown time  . meloxicam (MOBIC) 15 MG tablet Take 15 mg by mouth daily.   12/01/2015 at Unknown time  . Multiple Vitamin (MULTIVITAMIN WITH MINERALS) TABS tablet Take 1 tablet by mouth every morning.    12/01/2015 at Unknown time  . omeprazole (PRILOSEC) 20 MG capsule Take 20 mg by mouth every morning.    12/01/2015 at Unknown time  .  polyethylene glycol powder (GLYCOLAX/MIRALAX) powder Take 17 g by mouth daily as needed.  2 unknown  . pravastatin (PRAVACHOL) 40 MG tablet Take 40 mg by mouth at bedtime.    12/01/2015 at Unknown time  . traMADol (ULTRAM) 50 MG tablet Take 1-2 tablets by mouth every 4 (four) hours as needed for moderate pain.   1 unknown  . vitamin B-12 (CYANOCOBALAMIN) 1000 MCG tablet Take 1,000 mcg by mouth daily.   12/01/2015 at Unknown time    Assessment: Okay for Protocol, no bleeding noted.  D/C was planned for today.  New DVT Diagnosed today via ultrasound.  Goal of Therapy:  Systemic Anticoagulation.   Plan:  Apixaban 10mg  BID x 7 days, then 5mg  BID Apixaban teaching in AM. Monitor for signs and symptoms of bleeding.   Pricilla Larsson 12/03/2015,5:53 PM

## 2015-12-04 LAB — HEMOGLOBIN A1C
HEMOGLOBIN A1C: 6.1 % — AB (ref 4.8–5.6)
MEAN PLASMA GLUCOSE: 128 mg/dL

## 2015-12-07 LAB — RESPIRATORY VIRUS PANEL
ADENOVIRUS: POSITIVE — AB
INFLUENZA B 1: NEGATIVE
Influenza A: POSITIVE — AB
METAPNEUMOVIRUS: NEGATIVE
PARAINFLUENZA 3 A: NEGATIVE
Parainfluenza 1: NEGATIVE
Parainfluenza 2: NEGATIVE
RESPIRATORY SYNCYTIAL VIRUS A: NEGATIVE
RHINOVIRUS: NEGATIVE
Respiratory Syncytial Virus B: NEGATIVE

## 2016-03-07 ENCOUNTER — Institutional Professional Consult (permissible substitution): Payer: Medicare Other | Admitting: Neurology

## 2016-03-14 ENCOUNTER — Encounter: Payer: Self-pay | Admitting: Neurology

## 2016-03-14 ENCOUNTER — Ambulatory Visit (INDEPENDENT_AMBULATORY_CARE_PROVIDER_SITE_OTHER): Payer: Medicare Other | Admitting: Neurology

## 2016-03-14 VITALS — BP 114/70 | HR 90 | Ht 63.0 in | Wt 152.0 lb

## 2016-03-14 DIAGNOSIS — M21371 Foot drop, right foot: Secondary | ICD-10-CM

## 2016-03-14 DIAGNOSIS — M21372 Foot drop, left foot: Secondary | ICD-10-CM | POA: Diagnosis not present

## 2016-03-14 DIAGNOSIS — G63 Polyneuropathy in diseases classified elsewhere: Secondary | ICD-10-CM

## 2016-03-14 DIAGNOSIS — R269 Unspecified abnormalities of gait and mobility: Secondary | ICD-10-CM

## 2016-03-14 HISTORY — DX: Unspecified abnormalities of gait and mobility: R26.9

## 2016-03-14 HISTORY — DX: Foot drop, right foot: M21.371

## 2016-03-14 NOTE — Patient Instructions (Addendum)
Fall Prevention in the Home  Falls can cause injuries and can affect people from all age groups. There are many simple things that you can do to make your home safe and to help prevent falls. WHAT CAN I DO ON THE OUTSIDE OF MY HOME?  Regularly repair the edges of walkways and driveways and fix any cracks.  Remove high doorway thresholds.  Trim any shrubbery on the main path into your home.  Use bright outdoor lighting.  Clear walkways of debris and clutter, including tools and rocks.  Regularly check that handrails are securely fastened and in good repair. Both sides of any steps should have handrails.  Install guardrails along the edges of any raised decks or porches.  Have leaves, snow, and ice cleared regularly.  Use sand or salt on walkways during winter months.  In the garage, clean up any spills right away, including grease or oil spills. WHAT CAN I DO IN THE BATHROOM?  Use night lights.  Install grab bars by the toilet and in the tub and shower. Do not use towel bars as grab bars.  Use non-skid mats or decals on the floor of the tub or shower.  If you need to sit down while you are in the shower, use a plastic, non-slip stool..  Keep the floor dry. Immediately clean up any water that spills on the floor.  Remove soap buildup in the tub or shower on a regular basis.  Attach bath mats securely with double-sided non-slip rug tape.  Remove throw rugs and other tripping hazards from the floor. WHAT CAN I DO IN THE BEDROOM?  Use night lights.  Make sure that a bedside light is easy to reach.  Do not use oversized bedding that drapes onto the floor.  Have a firm chair that has side arms to use for getting dressed.  Remove throw rugs and other tripping hazards from the floor. WHAT CAN I DO IN THE KITCHEN?   Clean up any spills right away.  Avoid walking on wet floors.  Place frequently used items in easy-to-reach places.  If you need to reach for something  above you, use a sturdy step stool that has a grab bar.  Keep electrical cables out of the way.  Do not use floor polish or wax that makes floors slippery. If you have to use wax, make sure that it is non-skid floor wax.  Remove throw rugs and other tripping hazards from the floor. WHAT CAN I DO IN THE STAIRWAYS?  Do not leave any items on the stairs.  Make sure that there are handrails on both sides of the stairs. Fix handrails that are broken or loose. Make sure that handrails are as long as the stairways.  Check any carpeting to make sure that it is firmly attached to the stairs. Fix any carpet that is loose or worn.  Avoid having throw rugs at the top or bottom of stairways, or secure the rugs with carpet tape to prevent them from moving.  Make sure that you have a light switch at the top of the stairs and the bottom of the stairs. If you do not have them, have them installed. WHAT ARE SOME OTHER FALL PREVENTION TIPS?  Wear closed-toe shoes that fit well and support your feet. Wear shoes that have rubber soles or low heels.  When you use a stepladder, make sure that it is completely opened and that the sides are firmly locked. Have someone hold the ladder while you   are using it. Do not climb a closed stepladder.  Add color or contrast paint or tape to grab bars and handrails in your home. Place contrasting color strips on the first and last steps.  Use mobility aids as needed, such as canes, walkers, scooters, and crutches.  Turn on lights if it is dark. Replace any light bulbs that burn out.  Set up furniture so that there are clear paths. Keep the furniture in the same spot.  Fix any uneven floor surfaces.  Choose a carpet design that does not hide the edge of steps of a stairway.  Be aware of any and all pets.  Review your medicines with your healthcare provider. Some medicines can cause dizziness or changes in blood pressure, which increase your risk of falling. Talk  with your health care provider about other ways that you can decrease your risk of falls. This may include working with a physical therapist or trainer to improve your strength, balance, and endurance.   This information is not intended to replace advice given to you by your health care provider. Make sure you discuss any questions you have with your health care provider.   Document Released: 07/22/2002 Document Revised: 12/16/2014 Document Reviewed: 09/05/2014 Elsevier Interactive Patient Education 2016 [employer]  

## 2016-03-14 NOTE — Progress Notes (Signed)
Reason for visit: Peripheral neuropathy  Referring physician: Dr.Settle  [name] is a 82 y.o. female  History of present illness:   [name] is an 82 year old right-handed white female with a history of a peripheral neuropathy that initially was diagnosed in 2000. The patient was last seen through this office in May 2015. She has since undergone bilateral total knee replacements. This has helped her arthritic pain, but her balance and gait remained unsteady. The patient has had only one fall recently that was associated with a collapse of a chair, the patient did not stumble. She lives alone, but her living situation does not require climbing any stairs or steps. The patient has had increasing discomfort in the evening hours when she tries to sleep. She has better control of the pain during the daytime. She is on gabapentin taking 100 mg 3 times daily. She was recently told to go up to 200 mg at night which she has not yet done. She denies any significant issues recently with control of the bowels or the bladder. She does have chronic low back pain, she denies any neck discomfort. He also reports some numbness in the hands. The patient has swelling in the legs that also results in some discomfort. She has recently been treated for a deep venous thrombosis. She is sent to this office for an evaluation.  Past Medical History:  Diagnosis Date  . Abnormality of gait 03/14/2016  . Bilateral foot-drop 03/14/2016  . Cancer ([place])    hx skin cancer  . Chronic pain   . Constipation   . Difficulty swallowing    in the morning  . Double vision    right eye  . Dry skin   . Equilibrium disorder   . GERD (gastroesophageal reflux disease)    occasional  . Glaucoma   . H/O rotator cuff tear 10 years ago   right  . Heart murmur   . Hypertension    "not high- on medication for precaution"  . Hypothyroidism   . Knee pain    bilateral  . Osteoarthritis    both knees  . Polyneuropathy in  other diseases classified elsewhere ([place]) 01/02/2014  . Scoliosis   . Shoulder pain   . Spinal stenosis   . Spondylosis of lumbosacral region   . Urinary incontinence     Past Surgical History:  Procedure Laterality Date  . CATARACT EXTRACTION     bilateral  . DILATION AND CURETTAGE OF UTERUS    . ESOPHAGEAL DILATION  ~2012  . INGUINAL HERNIA REPAIR Bilateral    2011/2015  . SHOULDER SURGERY Left    "rebuilt"  . TOTAL ABDOMINAL HYSTERECTOMY  1991   with bladder tac  . TOTAL KNEE ARTHROPLASTY Right 01/21/2014   Procedure: RIGHT TOTAL KNEE ARTHROPLASTY;  Surgeon: Mauri Pole, MD;  Location: [facility];  Service: Orthopedics;  Laterality: Right;  . TOTAL KNEE ARTHROPLASTY Left 01/20/2015   Procedure: LEFT TOTAL KNEE ARTHROPLASTY;  Surgeon: Paralee Cancel, MD;  Location: [facility];  Service: Orthopedics;  Laterality: Left;    Family History  Problem Relation Age of Onset  . CVA Mother   . CVA Father   . Cancer Brother     Social history:  reports that she has never smoked. She has never used smokeless tobacco. She reports that she does not drink alcohol or use drugs.  Medications:  Prior to Admission medications   Medication Sig Start Date End Date Taking? Authorizing Provider  aspirin-acetaminophen-caffeine (EXCEDRIN MIGRAINE) 250-250-65 MG tablet Take 2 tablets by mouth 2 (two) times daily as needed (arthritic pain).   Yes Historical Provider, MD  brinzolamide (AZOPT) 1 % ophthalmic suspension Place 1 drop into both eyes 2 (two) times daily.    Yes Historical Provider, MD  calcium carbonate (OS-CAL) 600 MG TABS tablet Take 600 mg by mouth every morning.   Yes Historical Provider, MD  docusate sodium (COLACE) 100 MG capsule Take 1 capsule (100 mg total) by mouth 2 (two) times daily. Patient taking differently: Take 200 mg by mouth at bedtime.  01/22/15  Yes Danae Orleans, PA-C  gabapentin (NEURONTIN) 100 MG capsule TAKE 1 CAPSULE BY MOUTH THREE TIMES DAILY 06/30/14  Yes Kathrynn Ducking,  MD  Garlic 123XX123 MG TABS Take 1,000 mg by mouth every morning.    Yes Historical Provider, MD  HYDROcodone-acetaminophen (NORCO/VICODIN) 5-325 MG tablet Take 1 tablet by mouth every 6 (six) hours as needed for moderate pain.   Yes Historical Provider, MD  levothyroxine (SYNTHROID, LEVOTHROID) 75 MCG tablet Take 75 mcg by mouth daily before breakfast.   Yes Historical Provider, MD  magnesium gluconate (MAGONATE) 500 MG tablet Take 500 mg by mouth every morning.   Yes Historical Provider, MD  Multiple Vitamin (MULTIVITAMIN WITH MINERALS) TABS tablet Take 1 tablet by mouth every morning.    Yes Historical Provider, MD  omeprazole (PRILOSEC) 20 MG capsule Take 20 mg by mouth every morning.    Yes Historical Provider, MD  polyethylene glycol powder (GLYCOLAX/MIRALAX) powder Take 17 g by mouth daily as needed. 11/05/15  Yes Historical Provider, MD  pravastatin (PRAVACHOL) 40 MG tablet Take 40 mg by mouth at bedtime.    Yes Historical Provider, MD  traMADol (ULTRAM) 50 MG tablet Take 1-2 tablets by mouth every 4 (four) hours as needed for moderate pain.  10/23/15  Yes Historical Provider, MD  vitamin B-12 (CYANOCOBALAMIN) 1000 MCG tablet Take 1,000 mcg by mouth daily.   Yes Historical Provider, MD     No Known Allergies  ROS:  Out of a complete 14 system review of symptoms, the patient complains only of the following symptoms, and all other reviewed systems are negative.  Decreased appetite, fatigue Double vision Constipation Leg swelling Cold intolerance Joint pain, back pain, achy muscles, muscle cramps, walking difficulty  Blood pressure 114/70, pulse 90, height 5\' 3"  (1.6 m), weight 152 lb (68.9 kg).  Physical Exam  General: The patient is alert and cooperative at the time of the examination.  Eyes: Pupils are equal, round, and reactive to light. Discs are flat bilaterally.  Neck: The neck is supple, no carotid bruits are noted.  Respiratory: The respiratory examination is  clear.  Cardiovascular: The cardiovascular examination reveals a regular rate and rhythm, no obvious murmurs or rubs are noted.  Neuromuscular: The patient has restriction of abduction of the arms bilaterally, able to abduct only 30 on the left, 45 on the right.  Skin: Extremities are with 2+ edema below the knees bilaterally.  Neurologic Exam  Mental status: The patient is alert and oriented x 3 at the time of the examination. The patient has apparent normal recent and remote memory, with an apparently normal attention span and concentration ability.  Cranial nerves: Facial symmetry is present. There is good sensation of the face to pinprick and soft touch bilaterally. The strength of the facial muscles and the muscles to head turning and shoulder shrug are normal bilaterally. Speech is well enunciated, no aphasia or dysarthria  is noted. Extraocular movements are full. Visual fields are full. The tongue is midline, and the patient has symmetric elevation of the soft palate. No obvious hearing deficits are noted.  Motor: The motor testing reveals 5 over 5 strength of all 4 extremities, with exception of prominent bilateral foot drops. Good symmetric motor tone is noted throughout.  Sensory: Sensory testing is intact to pinprick, soft touch, vibration sensation, and position sense on the upper extremities. With the lower extremities, there is a stocking pattern pinprick sensory deficit to the knees bilaterally. Vibration sensation is significant impaired in both feet, the patient has no vibration sensation on the left knee, present on the right. Position sensation is markedly impaired on both feet. No evidence of extinction is noted.  Coordination: Cerebellar testing reveals good finger-nose-finger and heel-to-shin bilaterally.  Gait and station: Gait is wide-based, unsteady. The patient uses a walker for ambulation. The patient has positive Romberg, she has a tendency to fall  backwards.  Reflexes: Deep tendon reflexes are symmetric, but are depressed bilaterally. Toes are downgoing bilaterally.   Assessment/Plan:  1. Peripheral neuropathy  2. Gait disturbance  3. Bilateral foot drops  The patient has significant sensory impairment in both legs, with an associated gait disorder. She has bilateral foot drops that are quite significant. This is adding to her gait instability. The patient was given a prescription for bilateral AFO braces. The patient will go up on the gabapentin taking 100 mg twice during the day, 200 mg at night. We will adjust the dose depending upon her needs for pain control. The patient will follow-up in about 4 months.  Jill Alexanders MD 03/14/2016 12:06 PM  [facility] [address] [place], [place] [address]  Phone [phone] Fax [phone]

## 2016-07-14 ENCOUNTER — Ambulatory Visit: Payer: Medicare Other | Admitting: Adult Health

## 2017-04-18 ENCOUNTER — Emergency Department (HOSPITAL_COMMUNITY)
Admission: EM | Admit: 2017-04-18 | Discharge: 2017-04-18 | Disposition: A | Discharge status: Home / Self Care | Payer: Medicare Other | Attending: Emergency Medicine | Admitting: Emergency Medicine

## 2017-04-18 ENCOUNTER — Emergency Department (HOSPITAL_COMMUNITY): Payer: Medicare Other

## 2017-04-18 ENCOUNTER — Encounter (HOSPITAL_COMMUNITY): Payer: Self-pay | Admitting: *Deleted

## 2017-04-18 DIAGNOSIS — R1013 Epigastric pain: Secondary | ICD-10-CM | POA: Diagnosis not present

## 2017-04-18 DIAGNOSIS — E039 Hypothyroidism, unspecified: Secondary | ICD-10-CM | POA: Insufficient documentation

## 2017-04-18 DIAGNOSIS — K219 Gastro-esophageal reflux disease without esophagitis: Secondary | ICD-10-CM | POA: Diagnosis not present

## 2017-04-18 DIAGNOSIS — R112 Nausea with vomiting, unspecified: Secondary | ICD-10-CM | POA: Insufficient documentation

## 2017-04-18 DIAGNOSIS — I10 Essential (primary) hypertension: Secondary | ICD-10-CM | POA: Insufficient documentation

## 2017-04-18 LAB — COMPREHENSIVE METABOLIC PANEL
ALBUMIN: 4 g/dL (ref 3.5–5.0)
ALK PHOS: 44 U/L (ref 38–126)
ALT: 17 U/L (ref 14–54)
AST: 23 U/L (ref 15–41)
Anion gap: 7 (ref 5–15)
BUN: 24 mg/dL — ABNORMAL HIGH (ref 6–20)
CALCIUM: 9.8 mg/dL (ref 8.9–10.3)
CO2: 28 mmol/L (ref 22–32)
CREATININE: 0.77 mg/dL (ref 0.44–1.00)
Chloride: 102 mmol/L (ref 101–111)
GFR calc non Af Amer: >60 mL/min (ref >60)
Glucose, Bld: 101 mg/dL — ABNORMAL HIGH (ref 65–99)
Potassium: 4.7 mmol/L (ref 3.5–5.1)
SODIUM: 137 mmol/L (ref 135–145)
Total Bilirubin: 0.5 mg/dL (ref 0.3–1.2)
Total Protein: 6.7 g/dL (ref 6.5–8.1)

## 2017-04-18 LAB — CBC WITH DIFFERENTIAL/PLATELET
Basophils Absolute: 0 10*3/uL (ref 0.0–0.1)
Basophils Relative: 1 %
EOS ABS: 0.3 10*3/uL (ref 0.0–0.7)
EOS PCT: 5 %
HCT: 38.4 % (ref 36.0–46.0)
Hemoglobin: 12.1 g/dL (ref 12.0–15.0)
LYMPHS ABS: 1.8 10*3/uL (ref 0.7–4.0)
Lymphocytes Relative: 30 %
MCH: 30.1 pg (ref 26.0–34.0)
MCHC: 31.5 g/dL (ref 30.0–36.0)
MCV: 95.5 fL (ref 78.0–100.0)
MONOS PCT: 11 %
Monocytes Absolute: 0.7 10*3/uL (ref 0.1–1.0)
Neutro Abs: 3.1 10*3/uL (ref 1.7–7.7)
Neutrophils Relative %: 54 %
PLATELETS: 193 10*3/uL (ref 150–400)
RBC: 4.02 MIL/uL (ref 3.87–5.11)
RDW: 13.4 % (ref 11.5–15.5)
WBC: 5.8 10*3/uL (ref 4.0–10.5)

## 2017-04-18 LAB — LIPASE, BLOOD: Lipase: 25 U/L (ref 11–51)

## 2017-04-18 LAB — I-STAT TROPONIN, ED: TROPONIN I, POC: 0.02 ng/mL (ref 0.00–0.08)

## 2017-04-18 MED ORDER — SODIUM CHLORIDE 0.9 % IV BOLUS (SEPSIS)
500.0000 mL | Freq: Once | INTRAVENOUS | Status: AC
  Administered 2017-04-18: 500 mL via INTRAVENOUS

## 2017-04-18 MED ORDER — ONDANSETRON HCL 4 MG/2ML IJ SOLN
4.0000 mg | Freq: Once | INTRAMUSCULAR | Status: AC
Start: 2017-04-18 — End: 2017-04-18
  Administered 2017-04-18: 4 mg via INTRAVENOUS
  Filled 2017-04-18: qty 2

## 2017-04-18 MED ORDER — ONDANSETRON 4 MG PO TBDP: 4.0000 mg | ORAL_TABLET | Freq: Three times a day (TID) | ORAL | 0 refills | Status: DC | PRN

## 2017-04-18 MED ORDER — IOPAMIDOL (ISOVUE-300) INJECTION 61%
100.0000 mL | Freq: Once | INTRAVENOUS | Status: AC | PRN
  Administered 2017-04-18: 100 mL via INTRAVENOUS

## 2017-04-18 NOTE — ED Notes (Signed)
Gave pt cup of water to drink

## 2017-04-18 NOTE — Progress Notes (Signed)
Pt left watch and cell phone with son in room before coming to ct

## 2017-04-18 NOTE — ED Triage Notes (Signed)
Pt states she was eating breakfast today and after eating approx 75% of of her food and then she vomited. Last BM was today. Pt has epigastric pain as well. Hx of esophageal stretching and hernias.

## 2017-04-18 NOTE — ED Notes (Signed)
Pt tolerating water well

## 2017-04-18 NOTE — ED Provider Notes (Signed)
Emergency Department Provider Note   I have reviewed the triage vital signs and the nursing notes.   HISTORY  Chief Complaint Emesis   HPI [name] is a 83 y.o. female with PMH of GERD, HTN, difficulty swallowing, and esophageal stricture resents to the emergency department for evaluation of vomiting this morning at breakfast. Patient states she was feeling okay throughout the morning but toward the end of breakfast she began to feel very nauseated. She notes initially spitting out lots of mucus which soon turned into active vomiting of breakfast. Denies any fever, chills, CP, or SOB. She reports vomiting for approximately 30 minutes. He last BM was today. No diarrhea. No sick contacts. She had a similar episode last weekend. She has not had anything to eat or drink since vomiting today. No blood in the vomit. Her prior GI physician has retired and she has not seen anyone else for the last several years. Patient has been compliant with Omeprazole.    Past Medical History:  Diagnosis Date  . Abnormality of gait 03/14/2016  . Bilateral foot-drop 03/14/2016  . Cancer ([place])    hx skin cancer  . Chronic pain   . Constipation   . Difficulty swallowing    in the morning  . Double vision    right eye  . Dry skin   . Equilibrium disorder   . GERD (gastroesophageal reflux disease)    occasional  . Glaucoma   . H/O rotator cuff tear 10 years ago   right  . Heart murmur   . Hypertension    "not high- on medication for precaution"  . Hypothyroidism   . Knee pain    bilateral  . Osteoarthritis    both knees  . Polyneuropathy in other diseases classified elsewhere ([place]) 01/02/2014  . Scoliosis   . Shoulder pain   . Spinal stenosis   . Spondylosis of lumbosacral region   . Urinary incontinence     Patient Active Problem List   Diagnosis Date Noted  . Bilateral foot-drop 03/14/2016  . Abnormality of gait 03/14/2016  . Hypokalemia 12/02/2015  . Generalized weakness  12/02/2015  . Acute bronchitis 12/02/2015  . Hyponatremia 12/02/2015  . Leg pain   . S/P left TKA 01/20/2015  . S/P knee replacement 01/20/2015  . Cardiac murmur 06/09/2014  . Abdominal hernia 06/07/2014  . Abdominal pain, RLQ 06/07/2014  . Chronic pain   . Hypertension   . Hypothyroidism   . Overweight (BMI 25.0-29.9) 01/22/2014  . Expected blood loss anemia 01/22/2014  . Polyneuropathy in other diseases classified elsewhere ([place]) 01/02/2014    Past Surgical History:  Procedure Laterality Date  . CATARACT EXTRACTION     bilateral  . DILATION AND CURETTAGE OF UTERUS    . ESOPHAGEAL DILATION  ~2012  . INGUINAL HERNIA REPAIR Bilateral    2011/2015  . SHOULDER SURGERY Left    "rebuilt"  . TOTAL ABDOMINAL HYSTERECTOMY  1991   with bladder tac  . TOTAL KNEE ARTHROPLASTY Right 01/21/2014   Procedure: RIGHT TOTAL KNEE ARTHROPLASTY;  Surgeon: Mauri Pole, MD;  Location: [facility];  Service: Orthopedics;  Laterality: Right;  . TOTAL KNEE ARTHROPLASTY Left 01/20/2015   Procedure: LEFT TOTAL KNEE ARTHROPLASTY;  Surgeon: Paralee Cancel, MD;  Location: [facility];  Service: Orthopedics;  Laterality: Left;    Current Outpatient Rx  . Order #: [id number] Class: Historical Med  . Order #: [id number] Class: Historical Med  . Order #: [id number] Class: Historical Med  . Order #:  [id number] Class: No Print  . Order #: [id number] Class: Normal  . Order #: [id number] Class: Historical Med  . Order #: [id number] Class: Historical Med  . Order #: [id number] Class: Historical Med  . Order #: [id number] Class: Historical Med  . Order #: [id number] Class: Historical Med  . Order #: [id number] Class: Historical Med  . Order #: [id number] Class: Historical Med  . Order #: [id number] Class: Historical Med  . Order #: [id number] Class: Historical Med  . Order #: [id number] Class: Historical Med  . Order #: [id number] Class: Historical Med  . Order #: [id number] Class: Print    Allergies Patient has no known allergies.  Family History    Problem Relation Age of Onset  . CVA Mother   . CVA Father   . Cancer Brother     Social History Social History  Substance Use Topics  . Smoking status: Never Smoker  . Smokeless tobacco: Never Used  . Alcohol use No    Review of Systems  Constitutional: No fever/chills Eyes: No visual changes. ENT: No sore throat. Cardiovascular: Denies chest pain. Respiratory: Denies shortness of breath. Gastrointestinal: No abdominal pain. Positive nausea and vomiting.  No diarrhea.  No constipation. Genitourinary: Negative for dysuria. Musculoskeletal: Negative for back pain. Skin: Negative for rash. Neurological: Negative for headaches, focal weakness or numbness.  10-point ROS otherwise negative.  ____________________________________________   PHYSICAL EXAM:  VITAL SIGNS: ED Triage Vitals  Enc Vitals Group     BP 04/18/17 1229 122/80     Pulse Rate 04/18/17 1229 79     Resp 04/18/17 1229 18     Temp 04/18/17 1229 97.9 F (36.6 C)     Temp Source 04/18/17 1229 Oral     SpO2 04/18/17 1229 96 %     Weight 04/18/17 1230 152 lb (68.9 kg)     Height 04/18/17 1230 5\' 3"  (1.6 m)     Pain Score 04/18/17 1229 4    Constitutional: Alert and oriented. Well appearing and in no acute distress. Eyes: Conjunctivae are normal. Head: Atraumatic. Nose: No congestion/rhinnorhea. Mouth/Throat: Mucous membranes are slightly dry.  Neck: No stridor. Cardiovascular: Normal rate, regular rhythm. Good peripheral circulation. Grossly normal heart sounds.   Respiratory: Normal respiratory effort.  No retractions. Lungs CTAB. Gastrointestinal: Soft with diffuse mild/moderate tenderness. Positive mild distention.  Musculoskeletal: No lower extremity tenderness nor edema. No gross deformities of extremities. Neurologic:  Normal speech and language. No gross focal neurologic deficits are appreciated.  Skin:  Skin is warm, dry and intact. No rash  noted.  ____________________________________________   LABS (all labs ordered are listed, but only abnormal results are displayed)  Labs Reviewed  COMPREHENSIVE METABOLIC PANEL - Abnormal; Notable for the following:       Result Value   Glucose, Bld 101 (*)    BUN 24 (*)    All other components within normal limits  LIPASE, BLOOD  CBC WITH DIFFERENTIAL/PLATELET  I-STAT TROPONIN, ED   ____________________________________________  EKG   EKG Interpretation  Date/Time:  Tuesday April 18 2017 13:50:07 EDT Ventricular Rate:  68 PR Interval:    QRS Duration: 94 QT Interval:  419 QTC Calculation: 446 R Axis:   32 Text Interpretation:  Sinus rhythm Low voltage, precordial leads Nonspecific T abnormalities, lateral leads No STEMI.  Confirmed by Nanda Quinton [phone]) on 04/18/2017 1:55:40 PM       ____________________________________________  RADIOLOGY  Ct Abdomen Pelvis W Contrast  Result Date: 04/18/2017 CLINICAL DATA:  Vomiting and epigastric pain. EXAM: CT ABDOMEN AND PELVIS WITH CONTRAST TECHNIQUE: Multidetector CT  imaging of the abdomen and pelvis was performed using the standard protocol following bolus administration of intravenous contrast. CONTRAST:  160mL ISOVUE-300 IOPAMIDOL (ISOVUE-300) INJECTION 61% COMPARISON:  06/06/2014 FINDINGS: Lower chest: The lung bases are clear of acute process. Stable basilar scarring changes. The heart is normal in size. No pericardial effusion. Dense calcifications noted at the mitral valve annulus. Moderate tortuosity and calcification of the descending thoracic aorta. Hepatobiliary: No focal hepatic lesions or intrahepatic biliary dilatation. Small dependent gallstones are noted in the gallbladder. Normal caliber common bile duct. Pancreas: Moderate atrophy but no mass, inflammation or ductal dilatation. Spleen: Normal size.  No focal lesions. Adrenals/Urinary Tract: The adrenal glands and kidneys are unremarkable. No renal, ureteral or  bladder calculi or mass. The delayed images do not demonstrate any significant collecting system abnormalities. Stomach/Bowel: The stomach, duodenum, small bowel and colon are unremarkable. No acute inflammatory changes, mass lesions or obstructive findings. Colonic diverticulosis without findings for acute diverticulitis. High redundant left-sided cecum. Vascular/Lymphatic: Marked tortuosity and dense calcification of the abdominal aorta and branch vessels but no focal aneurysm or dissection. The major venous structures are grossly patent. No mesenteric or retroperitoneal mass or adenopathy. Reproductive: Surgically absent. Other: No pelvic mass or free pelvic fluid collections. No inguinal mass or adenopathy. Small inguinal hernias containing fat. No worrisome subcutaneous lesions. Musculoskeletal: Scoliosis and degenerative lumbar spondylosis but no worrisome bone lesions or fractures. Remote healed left-sided pubic rami fractures. IMPRESSION: 1. No acute abdominal/pelvic findings, mass lesions or lymphadenopathy. 2. Cholelithiasis without CT findings for acute cholecystitis. 3. Advanced atherosclerotic calcifications involving the aorta and branch vessels. Electronically Signed   By: Marijo Sanes M.D.   On: 04/18/2017 15:41    ____________________________________________   PROCEDURES  Procedure(s) performed:   Procedures  None ____________________________________________   INITIAL IMPRESSION / ASSESSMENT AND PLAN / ED COURSE  Pertinent labs & imaging results that were available during my care of the patient were reviewed by me and considered in my medical decision making (see chart for details).  Patient resents to the emergency department for evaluation of nausea and vomiting this morning with breakfast. Last night had some epigastric discomfort as if food may be stuck. Lower suspicion for atypical ACS in this case but with patient age plan for EKG and troponin. Will obtain CT for further  evaluation of possible GI cause such as hiatal hernia, malrotation, SBO, etc. Plan for IVF and reassessment.   04:15 PM Patient tolerating PO. CT with no acute findings. Gave contact information for GI.   At this time, I do not feel there is any life-threatening condition present. I have reviewed and discussed all results (EKG, imaging, lab, urine as appropriate), exam findings with patient. I have reviewed nursing notes and appropriate previous records.  I feel the patient is safe to be discharged home without further emergent workup. Discussed usual and customary return precautions. Patient and family (if present) verbalize understanding and are comfortable with this plan.  Patient will follow-up with their primary care provider. If they do not have a primary care provider, information for follow-up has been provided to them. All questions have been answered.  ____________________________________________  FINAL CLINICAL IMPRESSION(S) / ED DIAGNOSES  Final diagnoses:  Non-intractable vomiting with nausea, unspecified vomiting type  Epigastric pain     MEDICATIONS GIVEN DURING THIS VISIT:  Medications  sodium chloride 0.9 % bolus 500 mL (0 mLs Intravenous Stopped 04/18/17 1551)  ondansetron (ZOFRAN) injection 4 mg (4 mg Intravenous Given 04/18/17 1339)  iopamidol (ISOVUE-300)  61 % injection 100 mL (100 mLs Intravenous Contrast Given 04/18/17 1514)     NEW OUTPATIENT MEDICATIONS STARTED DURING THIS VISIT:  Discharge Medication List as of 04/18/2017  4:15 PM    START taking these medications   Details  ondansetron (ZOFRAN ODT) 4 MG disintegrating tablet Take 1 tablet (4 mg total) by mouth every 8 (eight) hours as needed for nausea or vomiting., Starting Tue 04/18/2017, Print        Note:  This document was prepared using Dragon voice recognition software and may include unintentional dictation errors.  Nanda Quinton, MD Emergency Medicine    Long, [facility], MD 04/19/17 [phone]

## 2017-04-18 NOTE — ED Notes (Signed)
Pt Esigned, but signature pad could not pick up signature.

## 2017-04-18 NOTE — Discharge Instructions (Signed)

## 2017-05-02 ENCOUNTER — Telehealth: Payer: Self-pay | Admitting: Gastroenterology

## 2017-05-02 NOTE — Telephone Encounter (Signed)
Previously seen by Dr. Gala Romney remotely. OK to schedule.

## 2017-05-02 NOTE — Telephone Encounter (Signed)
Pt was seen in the ED on 9/4 and was recommended to follow up with [place] for a EGD. Can we accept her as a new patient? Her son Richardson Landry) is the one that takes care of her and asked to call him at [phone]

## 2017-05-03 ENCOUNTER — Ambulatory Visit: Payer: Medicare Other | Admitting: Nurse Practitioner

## 2017-05-03 ENCOUNTER — Encounter: Payer: Self-pay | Admitting: Internal Medicine

## 2017-05-03 NOTE — Telephone Encounter (Signed)
I have offered patient's son [name]) several appointments that were sooner than November, but it was a conflict with his schedule. I have made OV with [facility] for 05/29/2017 at 8am and mailed letter.

## 2017-05-16 ENCOUNTER — Ambulatory Visit: Payer: Medicare Other | Admitting: Gastroenterology

## 2017-05-29 ENCOUNTER — Ambulatory Visit: Payer: Medicare Other | Admitting: Gastroenterology

## 2017-06-26 ENCOUNTER — Ambulatory Visit: Payer: Medicare Other | Admitting: Gastroenterology

## 2018-10-01 ENCOUNTER — Emergency Department (HOSPITAL_COMMUNITY): Payer: Medicare Other

## 2018-10-01 ENCOUNTER — Other Ambulatory Visit: Payer: Self-pay

## 2018-10-01 ENCOUNTER — Emergency Department (HOSPITAL_COMMUNITY)
Admission: EM | Admit: 2018-10-01 | Discharge: 2018-10-02 | Disposition: A | Discharge status: Home / Self Care | Payer: Medicare Other | Attending: Emergency Medicine | Admitting: Emergency Medicine

## 2018-10-01 ENCOUNTER — Encounter (HOSPITAL_COMMUNITY): Payer: Self-pay

## 2018-10-01 DIAGNOSIS — E039 Hypothyroidism, unspecified: Secondary | ICD-10-CM | POA: Insufficient documentation

## 2018-10-01 DIAGNOSIS — Y939 Activity, unspecified: Secondary | ICD-10-CM | POA: Insufficient documentation

## 2018-10-01 DIAGNOSIS — H532 Diplopia: Secondary | ICD-10-CM | POA: Diagnosis not present

## 2018-10-01 DIAGNOSIS — Y999 Unspecified external cause status: Secondary | ICD-10-CM | POA: Insufficient documentation

## 2018-10-01 DIAGNOSIS — I10 Essential (primary) hypertension: Secondary | ICD-10-CM | POA: Diagnosis not present

## 2018-10-01 DIAGNOSIS — Z79899 Other long term (current) drug therapy: Secondary | ICD-10-CM | POA: Insufficient documentation

## 2018-10-01 DIAGNOSIS — W1789XA Other fall from one level to another, initial encounter: Secondary | ICD-10-CM | POA: Diagnosis not present

## 2018-10-01 DIAGNOSIS — W19XXXA Unspecified fall, initial encounter: Secondary | ICD-10-CM

## 2018-10-01 DIAGNOSIS — Z7902 Long term (current) use of antithrombotics/antiplatelets: Secondary | ICD-10-CM | POA: Insufficient documentation

## 2018-10-01 DIAGNOSIS — Y92009 Unspecified place in unspecified non-institutional (private) residence as the place of occurrence of the external cause: Secondary | ICD-10-CM

## 2018-10-01 DIAGNOSIS — S3210XA Unspecified fracture of sacrum, initial encounter for closed fracture: Secondary | ICD-10-CM | POA: Insufficient documentation

## 2018-10-01 DIAGNOSIS — Y92000 Kitchen of unspecified non-institutional (private) residence as  the place of occurrence of the external cause: Secondary | ICD-10-CM | POA: Diagnosis not present

## 2018-10-01 DIAGNOSIS — Z96653 Presence of artificial knee joint, bilateral: Secondary | ICD-10-CM | POA: Insufficient documentation

## 2018-10-01 DIAGNOSIS — S3992XA Unspecified injury of lower back, initial encounter: Secondary | ICD-10-CM | POA: Diagnosis present

## 2018-10-01 HISTORY — DX: Other intervertebral disc degeneration, lumbar region: M51.36

## 2018-10-01 HISTORY — DX: Other intervertebral disc degeneration, lumbar region without mention of lumbar back pain or lower extremity pain: M51.369

## 2018-10-01 MED ORDER — PANTOPRAZOLE SODIUM 40 MG PO TBEC
40.0000 mg | DELAYED_RELEASE_TABLET | Freq: Every day | ORAL | Status: DC
  Administered 2018-10-02: 40 mg via ORAL
  Filled 2018-10-01: qty 1

## 2018-10-01 MED ORDER — HYDROCODONE-ACETAMINOPHEN 5-325 MG PO TABS
1.0000 | ORAL_TABLET | Freq: Four times a day (QID) | ORAL | Status: DC | PRN
  Administered 2018-10-02 (×2): 1 via ORAL
  Filled 2018-10-01: qty 2
  Filled 2018-10-01: qty 1

## 2018-10-01 MED ORDER — BRINZOLAMIDE 1 % OP SUSP
1.0000 [drp] | Freq: Two times a day (BID) | OPHTHALMIC | Status: DC
  Administered 2018-10-02: 1 [drp] via OPHTHALMIC
  Filled 2018-10-01: qty 10

## 2018-10-01 MED ORDER — LATANOPROST 0.005 % OP SOLN
1.0000 [drp] | Freq: Every day | OPHTHALMIC | Status: DC
  Filled 2018-10-01: qty 2.5

## 2018-10-01 MED ORDER — GABAPENTIN 100 MG PO CAPS
100.0000 mg | ORAL_CAPSULE | Freq: Two times a day (BID) | ORAL | Status: DC
  Administered 2018-10-02: 100 mg via ORAL
  Filled 2018-10-01: qty 1

## 2018-10-01 MED ORDER — MELOXICAM 7.5 MG PO TABS
15.0000 mg | ORAL_TABLET | Freq: Every day | ORAL | Status: DC
  Filled 2018-10-01 (×2): qty 2

## 2018-10-01 MED ORDER — LEVOTHYROXINE SODIUM 50 MCG PO TABS
75.0000 ug | ORAL_TABLET | Freq: Every day | ORAL | Status: DC
  Administered 2018-10-02: 75 ug via ORAL
  Filled 2018-10-01: qty 2

## 2018-10-01 MED ORDER — HYDROCODONE-ACETAMINOPHEN 5-325 MG PO TABS
1.0000 | ORAL_TABLET | Freq: Once | ORAL | Status: AC
  Administered 2018-10-01: 1 via ORAL
  Filled 2018-10-01: qty 1

## 2018-10-01 MED ORDER — DOCUSATE SODIUM 100 MG PO CAPS: 200.0000 mg | ORAL_CAPSULE | Freq: Every day | ORAL | Status: DC

## 2018-10-01 NOTE — ED Provider Notes (Signed)
[facility] EMERGENCY DEPARTMENT Provider Note   CSN: [id number] Arrival date & time: 10/01/18  1511    History   Chief Complaint Chief Complaint  Patient presents with  . Back Pain  . Fall    HPI AVE SCHARNHORST is a 85 y.o. female.     HPI  Pt was seen at 1915. Per pt and her family, c/o sudden onset and resolution of one episode of fall backwards that occurred today approximately 1330. Pt states she was opening the refrigerator door when she slipped and fell backwards "onto my back."  EMS was called, pt ambulated with assistance to her car, and she drove to this ED from [place]. Pt c/o lower back/tailbone area pain since the fall. Denies syncope/LOC, no AMS, no CP/SOB, no abd pain, no N/V/D, no focal motor weakness, no tingling/numbness in extremities.   Past Medical History:  Diagnosis Date  . Abnormality of gait 03/14/2016  . Bilateral foot-drop 03/14/2016  . Cancer ([place])    hx skin cancer  . Chronic pain   . Constipation   . DDD (degenerative disc disease), lumbar   . Difficulty swallowing    in the morning  . Double vision    right eye  . Dry skin   . Equilibrium disorder   . GERD (gastroesophageal reflux disease)    occasional  . Glaucoma   . H/O rotator cuff tear 10 years ago   right  . Heart murmur   . Hypertension    "not high- on medication for precaution"  . Hypothyroidism   . Knee pain    bilateral  . Osteoarthritis    both knees  . Polyneuropathy in other diseases classified elsewhere ([place]) 01/02/2014  . Scoliosis   . Shoulder pain   . Spinal stenosis   . Spondylosis of lumbosacral region   . Urinary incontinence     Patient Active Problem List   Diagnosis Date Noted  . Bilateral foot-drop 03/14/2016  . Abnormality of gait 03/14/2016  . Hypokalemia 12/02/2015  . Generalized weakness 12/02/2015  . Acute bronchitis 12/02/2015  . Hyponatremia 12/02/2015  . Leg pain   . S/P left TKA 01/20/2015  . S/P knee replacement 01/20/2015   . Cardiac murmur 06/09/2014  . Abdominal hernia 06/07/2014  . Abdominal pain, RLQ 06/07/2014  . Chronic pain   . Hypertension   . Hypothyroidism   . Overweight (BMI 25.0-29.9) 01/22/2014  . Expected blood loss anemia 01/22/2014  . Polyneuropathy in other diseases classified elsewhere ([place]) 01/02/2014    Past Surgical History:  Procedure Laterality Date  . CATARACT EXTRACTION     bilateral  . DILATION AND CURETTAGE OF UTERUS    . ESOPHAGEAL DILATION  ~2012  . INGUINAL HERNIA REPAIR Bilateral    2011/2015  . SHOULDER SURGERY Left    "rebuilt"  . TOTAL ABDOMINAL HYSTERECTOMY  1991   with bladder tac  . TOTAL KNEE ARTHROPLASTY Right 01/21/2014   Procedure: RIGHT TOTAL KNEE ARTHROPLASTY;  Surgeon: Mauri Pole, MD;  Location: [facility] ORS;  Service: Orthopedics;  Laterality: Right;  . TOTAL KNEE ARTHROPLASTY Left 01/20/2015   Procedure: LEFT TOTAL KNEE ARTHROPLASTY;  Surgeon: Paralee Cancel, MD;  Location: [facility]S;  Service: Orthopedics;  Laterality: Left;     OB History   No obstetric history on file.      Home Medications    Prior to Admission medications   Medication Sig Start Date End Date Taking? Authorizing Provider  aspirin-acetaminophen-caffeine ([place]) [phone] MG  tablet Take 2 tablets by mouth 2 (two) times daily as needed (arthritic pain).    [provider]  brinzolamide (AZOPT) 1 % ophthalmic suspension Place 1 drop into both eyes 2 (two) times daily.     [provider]  calcium carbonate (OS-CAL) 600 MG TABS tablet Take 600 mg by mouth every morning.    [provider]  docusate sodium (COLACE) 100 MG capsule Take 1 capsule (100 mg total) by mouth 2 (two) times daily. Patient taking differently: Take 200-300 mg by mouth at bedtime.  01/22/15   Danae Orleans, PA-C  gabapentin (NEURONTIN) 100 MG capsule TAKE 1 CAPSULE BY MOUTH THREE TIMES DAILY 06/30/14   Kathrynn Ducking, MD  Garlic 242 MG TABS Take 1,000 mg by mouth every  morning.     [provider]  HYDROcodone-acetaminophen (NORCO/VICODIN) 5-325 MG tablet Take 1 tablet by mouth every 6 (six) hours as needed for moderate pain.    [provider]  levothyroxine (SYNTHROID, LEVOTHROID) 75 MCG tablet Take 75 mcg by mouth daily before breakfast.    [provider]  magnesium gluconate (MAGONATE) 500 MG tablet Take 500 mg by mouth every morning.    [provider]  meloxicam (MOBIC) 15 MG tablet Take 15 mg by mouth daily.    [provider]  Multiple Vitamin (MULTIVITAMIN WITH MINERALS) TABS tablet Take 1 tablet by mouth every morning.     [provider]  omeprazole (PRILOSEC) 20 MG capsule Take 20 mg by mouth every morning.     [provider]  ondansetron (ZOFRAN ODT) 4 MG disintegrating tablet Take 1 tablet (4 mg total) by mouth every 8 (eight) hours as needed for nausea or vomiting. 04/18/17   Long, Wonda Olds, MD  polyethylene glycol powder (GLYCOLAX/MIRALAX) powder Take 17 g by mouth daily as needed. 11/05/15   [provider]  pravastatin (PRAVACHOL) 40 MG tablet Take 40 mg by mouth at bedtime.     [provider]  traMADol (ULTRAM) 50 MG tablet Take 1-2 tablets by mouth every 4 (four) hours as needed for moderate pain.  10/23/15   [provider]  vitamin B-12 (CYANOCOBALAMIN) 1000 MCG tablet Take 1,000 mcg by mouth daily.    [provider]    Family History Family History  Problem Relation Age of Onset  . CVA Mother   . CVA Father   . Cancer Brother     Social History Social History   Tobacco Use  . Smoking status: Never Smoker  . Smokeless tobacco: Never Used  Substance Use Topics  . Alcohol use: No  . Drug use: No     Allergies   Patient has no known allergies.   Review of Systems Review of Systems ROS: Statement: All systems negative except as marked or noted in the HPI; Constitutional: Negative for fever and chills. ; ; Eyes: Negative for  eye pain, redness and discharge. ; ; ENMT: Negative for ear pain, hoarseness, nasal congestion, sinus pressure and sore throat. ; ; Cardiovascular: Negative for chest pain, palpitations, diaphoresis, dyspnea and peripheral edema. ; ; Respiratory: Negative for cough, wheezing and stridor. ; ; Gastrointestinal: Negative for nausea, vomiting, diarrhea, abdominal pain, blood in stool, hematemesis, jaundice and rectal bleeding. . ; ; Genitourinary: Negative for dysuria, flank pain and hematuria. ; ; Musculoskeletal: +back pain. Negative for neck pain. Negative for swelling and trauma.; ; Skin: Negative for pruritus, rash, abrasions, blisters, bruising and skin lesion.; ; Neuro: Negative for headache,  lightheadedness and neck stiffness. Negative for weakness, altered level of consciousness, altered mental status, extremity weakness, paresthesias, involuntary movement, seizure and syncope.       Physical Exam Updated Vital Signs BP 140/65   Pulse 67   Temp 97.6 F (36.4 C) (Oral)   Resp 18   Ht 5\' 3"  (1.6 m)   Wt 64 kg   SpO2 100%   BMI 24.98 kg/m   Physical Exam 1920: Physical examination:  Nursing notes reviewed; Vital signs and O2 SAT reviewed;  Constitutional: Well developed, Well nourished, Well hydrated, In no acute distress; Head:  Normocephalic, atraumatic; Eyes: EOMI, PERRL, No scleral icterus; ENMT: Mouth and pharynx normal, Mucous membranes moist; Neck: Supple, Full range of motion, No lymphadenopathy; Cardiovascular: Regular rate and rhythm, No gallop; Respiratory: Breath sounds clear & equal bilaterally, No wheezes.  Speaking full sentences with ease, Normal respiratory effort/excursion; Chest: Nontender, Movement normal; Abdomen: Soft, Nontender, Nondistended, Normal bowel sounds; Genitourinary: No CVA tenderness; Spine:  No midline CS, TS, LS tenderness. +TTP lumbar paraspinal muscles. +generalized sacral tenderness to palp.;;  Extremities: Peripheral pulses normal, Pelvis stable. NT  bilat hips/knees/ankles/feet. Pt is able to lift bilat LE's off stretcher. +1 pedal edema bilat. No calf edema or asymmetry.; Neuro: AA&Ox3, Major CN grossly intact.  Speech clear. No gross focal motor or sensory deficits in extremities.; Skin: Color normal, Warm, Dry.   ED Treatments / Results  Labs (all labs ordered are listed, but only abnormal results are displayed)   EKG None  Radiology   Procedures Procedures (including critical care time)  Medications Ordered in ED Medications  HYDROcodone-acetaminophen (NORCO/VICODIN) 5-325 MG per tablet 1 tablet (1 tablet Oral Given 10/01/18 1938)     Initial Impression / Assessment and Plan / ED Course  I have reviewed the triage vital signs and the nursing notes.  Pertinent labs & imaging results that were available during my care of the patient were reviewed by me and considered in my medical decision making (see chart for details).    MDM Reviewed: previous chart, nursing note and vitals Interpretation: x-ray and CT scan    Dg Chest 2 View Result Date: 10/01/2018 CLINICAL DATA:  Patient fell on her back today. Pain. EXAM: CHEST - 2 VIEW COMPARISON:  CXR 12/02/2015 FINDINGS: Stable cardiomegaly with tortuous atherosclerotic aorta. Clear lungs without pulmonary consolidation, effusion or pneumothorax. No overt pulmonary edema. High-riding humeral heads bilaterally with remodeled appearance of the right humeral head likely from chronic abutment upon the undersurface of the acromion. Soft tissue calcified density adjacent to the right humeral head may reflect chronic rotator cuff tendinosis. No acute displaced rib fracture. Thoracic spondylosis is noted without acute appearing fracture. IMPRESSION: Stable cardiomegaly with tortuous atherosclerotic aorta. No active pulmonary disease. No acute osseous appearing abnormality. High-riding humeral heads which can be seen in chronic rotator cuff tears. Remodeled appearance of the right humeral  head likely degenerative in etiology. Electronically Signed   By: Ashley Royalty M.D.   On: 10/01/2018 20:27   Dg Lumbar Spine Complete Result Date: 10/01/2018 CLINICAL DATA:  Back pain after fall. EXAM: LUMBAR SPINE - COMPLETE 4+ VIEW COMPARISON:  CT AP 04/18/2017 FINDINGS: Levoconvex scoliosis of the lumbar spine. Chronic stable degenerative anterolisthesis of L4 on L5 and L5 on S1 associated with multilevel degenerative disc and facet arthropathy. Aortic atherosclerosis is noted without aneurysmal dilatation. The sacrum coccyx appears largely intact. IMPRESSION: 1. Lumbar spondylosis with levoconvex scoliosis. 2. Multilevel degenerative disc and facet arthropathy. 3. Grade 1 anterolisthesis  of L4 on L5 and L5 on S1. 4. No acute osseous abnormality. Electronically Signed   By: Ashley Royalty M.D.   On: 10/01/2018 20:32   Dg Pelvis 1-2 Views Result Date: 10/01/2018 CLINICAL DATA:  Patient fell onto back today and has severe lower back pain. EXAM: PELVIS - 1-2 VIEW COMPARISON:  CT 04/18/2017 FINDINGS: Remote left parasymphyseal and inferior pubic ramus fractures with healing. Levoconvex curvature of the included lower lumbar spine with facet arthropathy is stable. No pelvic diastasis. Osteoarthritis of the sacroiliac joints and pubic symphysis. No acute pelvic fracture. Joints are maintained bilaterally. Lucencies traversing the left proximal femur compatible with overlying skin fold artifacts. IMPRESSION: No acute pelvic fracture. Remote left parasymphyseal and inferior pubic ramus fractures. Chronic stable levoconvex curvature of the lower lumbar spine with facet arthropathy. Osteoarthritis of the sacroiliac joints and pubic symphysis. No acute pelvic fracture. Electronically Signed   By: Ashley Royalty M.D.   On: 10/01/2018 20:30   Dg Sacrum/coccyx Result Date: 10/01/2018 CLINICAL DATA:  Patient fell on back today and has severe lower back pain. EXAM: SACRUM AND COCCYX - 2+ VIEW COMPARISON:  CT pelvis 04/18/2017  FINDINGS: Degenerative disc and facet arthropathy of the included lower lumbar spine, both sacroiliac joints and pubic symphysis. Remote left parasymphyseal and inferior pubic rami fractures with healing. Age-indeterminate fracture of the left mid sacrum with slight displaced appearance of the floor of the left S2 neural foramen. This is not apparent on the prior pelvic CT from 2018. IMPRESSION: Age-indeterminate fracture of the left mid sacrum with slightly displaced appearance of the floor of the left S2 neural foramen. Electronically Signed   By: Ashley Royalty M.D.   On: 10/01/2018 20:38   Ct Head Wo Contrast Result Date: 10/01/2018 CLINICAL DATA:  Patient fell on back today while standing in the kitchen. EXAM: CT HEAD WITHOUT CONTRAST CT CERVICAL SPINE WITHOUT CONTRAST TECHNIQUE: Multidetector CT imaging of the head and cervical spine was performed following the standard protocol without intravenous contrast. Multiplanar CT image reconstructions of the cervical spine were also generated. COMPARISON:  None. FINDINGS: CT HEAD FINDINGS BRAIN: There is sulcal and ventricular prominence consistent with superficial and central atrophy. No intraparenchymal hemorrhage, mass effect nor midline shift. Periventricular and subcortical white matter hypodensities consistent with chronic small vessel ischemic disease are identified. No acute large vascular territory infarcts. No abnormal extra-axial fluid collections. Basal cisterns are not effaced and midline. VASCULAR: Moderate calcific atherosclerosis of the carotid siphons. SKULL: No skull fracture. No significant scalp soft tissue swelling. SINUSES/ORBITS: The mastoid air-cells are clear. The included paranasal sinuses are well-aerated.Bilateral cataract extractions. Intact orbits and globes. OTHER: Intact temporomandibular joints. CT CERVICAL SPINE FINDINGS Alignment: Reversal cervical lordosis with grade 1 anterolisthesis of C2 on C3, C3 on C4 and C4 on C5. Grade 1  anterolisthesis is also noted at C7 on T1, T1 and T2 and T2 on T3. Skull base and vertebrae: Intact Soft tissues and spinal canal: No prevertebral fluid or swelling. No visible canal hematoma. Disc levels: Mild disc flattening C3-4, moderate C4-5 and marked from C5 through C7. Uncovertebral joint osteoarthritis with uncinate spurring is seen on the left at C3-4, bilaterally at C4-5, C5-6 and C6-7. Upper chest: Clear Other: Left-sided tonsillolith is noted. Moderate extracranial carotid arteriosclerosis. IMPRESSION: 1. Atrophy with chronic small vessel ischemic disease. No acute intracranial abnormality. 2. Cervical spondylosis without acute posttraumatic cervical spine fracture. Electronically Signed   By: Ashley Royalty M.D.   On: 10/01/2018 21:04   Ct  Cervical Spine Wo Contrast Result Date: 10/01/2018 CLINICAL DATA:  Patient fell on back today while standing in the kitchen. EXAM: CT HEAD WITHOUT CONTRAST CT CERVICAL SPINE WITHOUT CONTRAST TECHNIQUE: Multidetector CT imaging of the head and cervical spine was performed following the standard protocol without intravenous contrast. Multiplanar CT image reconstructions of the cervical spine were also generated. COMPARISON:  None. FINDINGS: CT HEAD FINDINGS BRAIN: There is sulcal and ventricular prominence consistent with superficial and central atrophy. No intraparenchymal hemorrhage, mass effect nor midline shift. Periventricular and subcortical white matter hypodensities consistent with chronic small vessel ischemic disease are identified. No acute large vascular territory infarcts. No abnormal extra-axial fluid collections. Basal cisterns are not effaced and midline. VASCULAR: Moderate calcific atherosclerosis of the carotid siphons. SKULL: No skull fracture. No significant scalp soft tissue swelling. SINUSES/ORBITS: The mastoid air-cells are clear. The included paranasal sinuses are well-aerated.Bilateral cataract extractions. Intact orbits and globes. OTHER:  Intact temporomandibular joints. CT CERVICAL SPINE FINDINGS Alignment: Reversal cervical lordosis with grade 1 anterolisthesis of C2 on C3, C3 on C4 and C4 on C5. Grade 1 anterolisthesis is also noted at C7 on T1, T1 and T2 and T2 on T3. Skull base and vertebrae: Intact Soft tissues and spinal canal: No prevertebral fluid or swelling. No visible canal hematoma. Disc levels: Mild disc flattening C3-4, moderate C4-5 and marked from C5 through C7. Uncovertebral joint osteoarthritis with uncinate spurring is seen on the left at C3-4, bilaterally at C4-5, C5-6 and C6-7. Upper chest: Clear Other: Left-sided tonsillolith is noted. Moderate extracranial carotid arteriosclerosis. IMPRESSION: 1. Atrophy with chronic small vessel ischemic disease. No acute intracranial abnormality. 2. Cervical spondylosis without acute posttraumatic cervical spine fracture. Electronically Signed   By: Ashley Royalty M.D.   On: 10/01/2018 21:04    2130:  Pain meds given. XR sacrum with fx; CT ordered to further delineate. Dx and testing d/w pt and family.  Questions answered.  Verb understanding, agreeable with CT scan.   2230:  CT scan pending; likely will need Ortho MD consult after scan. Dispo based on results. Sign out to Dr. Melina Copa.     Final Clinical Impressions(s) / ED Diagnoses   Final diagnoses:  None    ED Discharge Orders    None       Francine Graven, DO 10/04/18 0004

## 2018-10-01 NOTE — ED Provider Notes (Signed)
Note from Dr. Thurnell Garbe.  85 year old female who fell earlier today onto her backside.  Since then she has had pain in her lower back and across her sacrum.  Plan is to follow-up on CT imaging of her pelvis.  By plain films she had signs of a sacral fracture.  If she has a fracture, reviewed with orthopedics and dispel per the recommendations.  Clinical Course as of Oct 01 2345  Mon Oct 01, 2018  2337 Discussed with Dr. Aline Brochure from orthopedics.  He said the plan for this fracture would be no surgical repair weightbearing as tolerated usually with a walker.  Patient herself said she would be unable to return home as she lives at home and cannot manage.  As we have no beds a will talk to the overnight physician and the nurse manager regarding boarding her care until a PT consult in the morning along with case management social work for possible placement.   [MB]    Clinical Course User Index [MB] Hayden Rasmussen, MD      Hayden Rasmussen, MD 10/01/18 [phone]

## 2018-10-01 NOTE — ED Triage Notes (Signed)
Pt reports that she fell flatt on her back today while standing in kitchen. EMS assisted getting in car and pt drove from [place]

## 2018-10-02 DIAGNOSIS — S3210XA Unspecified fracture of sacrum, initial encounter for closed fracture: Secondary | ICD-10-CM | POA: Diagnosis not present

## 2018-10-02 NOTE — Discharge Instructions (Addendum)
Home Health will follow-up with you.

## 2018-10-02 NOTE — Care Management (Signed)
Patient Information   Patient Name [name], [name] ([id number]) Sex Female DOB [birth year]  Room Bed  APA19 APA19  Patient Demographics   Address [address] [place] [address] Phone [phone] [facility]) [phone] (Mobile)  Patient Ethnicity & Race   Ethnic Group Patient Race  Not Hispanic or Latino White or Caucasian  Emergency Contact(s)   Name Relation Home Work Wade [place] Son [phone]  [phone]  V[place]elle Daughter   [phone]  Documents on File    Status Date Received Description  Documents for the Patient  EMR Patient Summary  07/29/10   Historic Radiology Documentation  11/04/10   Historic Radiology Documentation  11/04/10   [place] Received 11/14/11   [place] E-Signature HIPAA Notice of Privacy Received 06/06/14   [place] E-Signature HIPAA Notice of Privacy Spanish     Driver's License Received 12/75/17   Insurance Card Not Received    Advance Directives/Living Will/HCPOA/POA Not Received    Financial Application Received 00/17/49   Insurance Card Received 01/15/15 AETNA 16  Insurance Card  11/14/11   [place] HIPAA NOTICE OF PRIVACY - Scanned Received 11/14/11   [place] HIPAA NOTICE OF PRIVACY - Scanned  11/14/11   Advanced Beneficiary Notice (ABN) Not Received    Insurance Card Received 01/02/14 MCR BCBS  GNA  AMB HH/NH/Hospice  01/07/14   AMB Correspondence  01/03/14 ORDER [facility]  HIM ROI Authorization (Expired) 06/17/14   Release of Information  07/11/14   Other Photo ID Not Received    Insurance Card Received 12/02/15 [place] SUPP 2017  HIM ROI Authorization (Expired) 12/04/15 Records from 12/02/15 to 12/03/15 hospital stay for continuity of care. Patient has appointment on 12/21/15 for hospital follow up.  Release of Information  12/04/15   HIM ROI Authorization (Expired) 12/17/15 Patient has appointment on 12/21/15. Records  from 12/02/15 to 12/03/15 for continuity of care.  Release of Information  12/18/15   E-Signature AOB [place] Not Received    [place] E-Signature HIPAA Notice of Privacy Signed 03/14/16 gna   E-Signature HIPAA Notice of Privacy Signed 10/01/18   Documents for the Encounter  AOB (Assignment of Insurance Benefits) Not Received    E-signature AOB Signed 10/01/18   MEDICARE RIGHTS Not Received    E-signature Medicare Rights Signed 10/01/18   ED Patient Billing Extract   ED PB Billing Extract  Admission Information   Current Information   Attending Provider Admitting Provider Admission Type Admission Status  Nat Christen, MD  Emergency Admission (Confirmed)       Admission Date/Time Discharge Date Hospital Service Auth/Cert Status  44/96/75 07:06 PM  Emergency Medicine [place] [facility] Room/Bed   [place] DEPT APA19/APA19        Admission   Complaint  [facility] [employer] Account   Name Acct ID Class Status Primary Coverage  Trystan, Eads [id number] Emergency Open MEDICARE - MEDICARE PART A AND B      Guarantor Account (for Hospital Account [id number])   Name Relation to Pt Service Area Active? Acct Type  Judeth Porch Self CHSA Yes Personal/Family  Address Phone    [address] [place] [address] [phone])        Coverage Information (for Hospital Account [id number])   1. MEDICARE/MEDICARE PART A AND B   F/O Payor/Plan Precert #  MEDICARE/MEDICARE PART A AND B  Subscriber Subscriber #  Ladesha, Pacini [id number]  Address Phone  [address] [place], [place] [address]   2. AETNA/AETNA SENIOR SUPPLEMENTAL INSURANCE   F/O Materials engineer #  AETNA/AETNA [facility]   Subscriber Subscriber #  [place], Conkright [id number]  [place] [address]        Care Everywhere ID:  [phone]

## 2018-10-02 NOTE — ED Notes (Signed)
Pt called out for pain medication. Informed pt it was not time for her Norco at this time. Pt states "I don't take Norco and don't want anymore of that." Informed pt she does not have to take Norco if she doesn't want to. Pt verbalized understanding.

## 2018-10-02 NOTE — Evaluation (Signed)
Physical Therapy Evaluation Patient Details Name: [name] MRN: [id number] DOB: October 11, 1924 Today's Date: 10/02/2018   History of Present Illness  [name] is a 85 y/o old female s/p fall onto her backside with c/o lower back and sacral pain, found to have sacral fracture    Clinical Impression  Patient medicated for pain prior to treatment.  Patient limited to log rolling only due to c/o severe pain in low back and tail bone, declined to accept higher dosage of pain medication and unable to attempt sitting up at bedside or transfers due to pain - RN notified.  Patient will benefit from continued physical therapy in hospital and recommended venue below to increase strength, balance, endurance for safe ADLs and gait.    Follow Up Recommendations SNF    Equipment Recommendations  None recommended by PT    Recommendations for Other Services       Precautions / Restrictions Precautions Precautions: Fall Restrictions Weight Bearing Restrictions: No      Mobility  Bed Mobility Overal bed mobility: Needs Assistance Bed Mobility: Rolling Rolling: Min assist         General bed mobility comments: Patient c/o severe pain with any movement and refused increase in pain medication  Transfers                    Ambulation/Gait                Stairs            Wheelchair Mobility    Modified Rankin (Stroke Patients Only)       Balance                                             Pertinent Vitals/Pain Pain Assessment: Faces Faces Pain Scale: Hurts whole lot Pain Location: low back, tail bone area Pain Descriptors / Indicators: Aching;Discomfort;Grimacing;Sharp;Sore Pain Intervention(s): Limited activity within patient's tolerance;Monitored during session;Premedicated before session;Repositioned    Home Living Family/patient expects to be discharged to:: Private residence Living Arrangements: Alone Available Help at  Discharge: Family;Available PRN/intermittently Type of Home: Apartment Home Access: Level entry;Elevator     Home Layout: Multi-level Home Equipment: Walker - 4 wheels;Bedside commode;Wheelchair - manual      Prior Function Level of Independence: Independent with assistive device(s)         Comments: household ambulator with Rollator, Her son assists with shopping/community ADLs     Hand Dominance        Extremity/Trunk Assessment   Upper Extremity Assessment Upper Extremity Assessment: Generalized weakness    Lower Extremity Assessment Lower Extremity Assessment: Generalized weakness    Cervical / Trunk Assessment Cervical / Trunk Assessment: Normal  Communication   Communication: No difficulties  Cognition Arousal/Alertness: Awake/alert Behavior During Therapy: WFL for tasks assessed/performed;Anxious Overall Cognitive Status: Within Functional Limits for tasks assessed                                        General Comments      Exercises     Assessment/Plan    PT Assessment Patient needs continued PT services  PT Problem List Decreased strength;Decreased activity tolerance;Decreased balance;Decreased mobility;Pain       PT Treatment Interventions Gait training;Functional mobility training;Therapeutic activities;Therapeutic exercise;Patient/family  education    PT Goals (Current goals can be found in the Care Plan section)  Acute Rehab PT Goals Patient Stated Goal: return home after rehab PT Goal Formulation: With patient Time For Goal Achievement: 10/16/18 Potential to Achieve Goals: Fair    Frequency Min 3X/week   Barriers to discharge        Co-evaluation               AM-PAC PT "6 Clicks" Mobility  Outcome Measure Help needed turning from your back to your side while in a flat bed without using bedrails?: A Lot Help needed moving from lying on your back to sitting on the side of a flat bed without using bedrails?:  Total Help needed moving to and from a bed to a chair (including a wheelchair)?: Total Help needed standing up from a chair using your arms (e.g., wheelchair or bedside chair)?: Total Help needed to walk in hospital room?: Total Help needed climbing 3-5 steps with a railing? : Total 6 Click Score: 7    End of Session   Activity Tolerance: Patient limited by fatigue;Patient limited by pain Patient left: in bed;with call bell/phone within reach Nurse Communication: Mobility status PT Visit Diagnosis: Unsteadiness on feet (R26.81);Other abnormalities of gait and mobility (R26.89);Muscle weakness (generalized) (M62.81)    Time: 0810-0829 PT Time Calculation (min) (ACUTE ONLY): 19 min   Charges:   PT Evaluation $PT Eval Moderate Complexity: 1 Mod PT Treatments $Therapeutic Activity: 8-22 mins        12:15 PM, 10/02/18 [name], MPT Physical Therapist with [facility] [phone] office [phone] mobile phone

## 2018-10-02 NOTE — Care Management (Signed)
Patient Information   Patient Name [name], [name] ([id number]) Sex Female DOB December 04, 1924  Room Bed  APA19 APA19  Patient Demographics   Address [address] [place] [address] Phone [phone] [facility]) [phone] (Mobile)  Patient Ethnicity & Race   Ethnic Group Patient Race  Not Hispanic or Latino White or Caucasian  Emergency Contact(s)   Name Relation Home Work [place] Son [phone]  [phone]  [place] Daughter   [phone]  Documents on File    Status Date Received Description  Documents for the Patient  EMR Patient Summary  07/29/10   Historic Radiology Documentation  11/04/10   Historic Radiology Documentation  11/04/10   [place] Received 11/14/11   [place] E-Signature HIPAA Notice of Privacy Received 06/06/14   [place] E-Signature HIPAA Notice of Privacy Spanish     Driver's License Received 80/03/49   Insurance Card Not Received    Advance Directives/Living Will/HCPOA/POA Not Received    Financial Application Received 17/91/50   Insurance Card Received 01/15/15 AETNA 16  Insurance Card  11/14/11   [place] HIPAA NOTICE OF PRIVACY - Scanned Received 11/14/11   [place] HIPAA NOTICE OF PRIVACY - Scanned  11/14/11   Advanced Beneficiary Notice (ABN) Not Received    Insurance Card Received 01/02/14 MCR BCBS  GNA  AMB HH/NH/Hospice  01/07/14   AMB Correspondence  01/03/14 ORDER [facility]  HIM ROI Authorization (Expired) 06/17/14   Release of Information  07/11/14   Other Photo ID Not Received    Insurance Card Received 12/02/15 [place] SUPP 2017  HIM ROI Authorization (Expired) 12/04/15 Records from 12/02/15 to 12/03/15 hospital stay for continuity of care. Patient has appointment on 12/21/15 for hospital follow up.  Release of Information  12/04/15   HIM ROI Authorization (Expired) 12/17/15 Patient has appointment on 12/21/15. Records  from 12/02/15 to 12/03/15 for continuity of care.  Release of Information  12/18/15   E-Signature AOB Spanish Not Received    [place] E-Signature HIPAA Notice of Privacy Signed 03/14/16 gna  [place] E-Signature HIPAA Notice of Privacy Signed 10/01/18   Documents for the Encounter  AOB (Assignment of Insurance Benefits) Not Received    E-signature AOB Signed 10/01/18   MEDICARE RIGHTS Not Received    E-signature Medicare Rights Signed 10/01/18   ED Patient Billing Extract   ED PB Billing Extract  Admission Information   Current Information   Attending Provider Admitting Provider Admission Type Admission Status  Nat Christen, MD  Emergency Admission (Confirmed)       Admission Date/Time Discharge Date Hospital Service Auth/Cert Status  56/97/94 07:06 PM  Emergency Medicine [place] Unit Room/Bed   [facility] DEPT APA19/APA19        Admission   Complaint  [facility] Account   Name Acct ID Class Status Primary Coverage  [name], [name] [id number] Emergency Open MEDICARE - MEDICARE PART A AND B      Guarantor Account (for Hospital Account [id number])   Name Relation to Pt Service Area Active? Acct Type  Judeth Porch Self CHSA Yes Personal/Family  Address Phone    [address] [place] [address] [phone])        Coverage Information (for Hospital Account [id number])   1. MEDICARE/MEDICARE PART A AND B   F/O Payor/Plan Precert #  MEDICARE/MEDICARE PART A AND B  Subscriber Subscriber #  [name], [name] [id number]  Address Phone  [address] [place], [place] [address]   2. AETNA/AETNA SENIOR SUPPLEMENTAL INSURANCE   F/O Materials engineer #  AETNA/AETNA [facility]   Subscriber Subscriber #  [name], [name] [id number]  Address Phone  [address] [place] [address]        Care Everywhere ID:  [id number]

## 2018-10-02 NOTE — Plan of Care (Signed)
  Problem: Acute Rehab PT Goals(only PT should resolve) Goal: Pt Will Go Supine/Side To Sit Outcome: Progressing Flowsheets (Taken 10/02/2018 1216) Pt will go Supine/Side to Sit: with minimal assist; with moderate assist Goal: Patient Will Transfer Sit To/From Stand Outcome: Progressing Flowsheets (Taken 10/02/2018 1216) Patient will transfer sit to/from stand: with minimal assist; with moderate assist Goal: Pt Will Transfer Bed To Chair/Chair To Bed Outcome: Progressing Flowsheets (Taken 10/02/2018 1216) Pt will Transfer Bed to Chair/Chair to Bed: with min assist; with mod assist Goal: Pt Will Ambulate Outcome: Progressing Flowsheets (Taken 10/02/2018 1216) Pt will Ambulate: 25 feet; with moderate assist; with rolling walker   12:16 PM, 10/02/18 [name], MPT Physical Therapist with [facility] [phone] office [phone] mobile phone

## 2018-10-02 NOTE — ED Notes (Signed)
Pt placed on hospital bed for comfort.

## 2018-10-02 NOTE — Clinical Social Work Note (Signed)
Clinical Social Work Assessment  Patient Details  Name: [name] MRN: [id number] Date of Birth: 05-11-1925  Date of referral:  10/02/18               Reason for consult:  Discharge Planning, Facility Placement                Permission sought to share information with:  Family Supports Permission granted to share information::  Yes, Verbal Permission Granted  Name::     [name]  Agency::     Relationship::  son  Contact Information:  [phone]  Housing/Transportation Living arrangements for the past 2 months:  Apartment Source of Information:  Patient Patient Interpreter Needed:  None Criminal Activity/Legal Involvement Pertinent to Current Situation/Hospitalization:    Significant Relationships:  Adult Children Lives with:  Self Do you feel safe going back to the place where you live?  No Need for family participation in patient care:  Yes (Comment)  Care giving concerns: Pt states she has a rollator at home that she uses regularly, along with bedside commode that she does not use.  Also does not use the tup or shower, but bathes herself and does all ADL's.  Lives by herself in apartment that sounds like it is designed specifically for elderly population.  Son and daughter check in by phone or in person daily.  She is here as the result of a fall at home, and she requested family bring her here rather than going to [facility].   Social Worker assessment / plan:  85 YO Caucasian female here for assessment/treatment post fall at home.  States she went to rehab several years ago following shoulder surgery, and had [place] services as a result.  Pt recommended for SNF by PT, but is not eligible for rehab as [facility] requires a 3 night stay inpatient. Pt understands this, states her son has been working on getting her MCD since November when she last had an ED visit and was trying to get into short term care.  CSW called son to tell him we will not be able to place his mother.   After absorbing the blow, he stated he would be here to pick her up after his wife's medical appointment which is in [place] at 3:30.  He asked for referral to [facility] in [place].  I encouraged he and his sister to put together a plan that has one of them with their mother post d/c.  Alerted Nurse CM re: [place] referral.   Employment status:  Retired Forensic scientist:  Medicare PT Recommendations:    Information / Referral to community resources:  Other (Comment Required)([facility])  Patient/Family's Response to care:  Unhappy that we are not getting patient into rehab.  Patient/Family's Understanding of and Emotional Response to Diagnosis, Current Treatment, and Prognosis:  Pt has full understanding; emotional response is to complain.  Emotional Assessment Appearance:    Attitude/Demeanor/Rapport:  Complaining Affect (typically observed):  Anxious, Agitated Orientation:  Oriented to Self, Oriented to Place, Oriented to Situation Alcohol / Substance use:  Not Applicable Psych involvement (Current and /or in the community):  No (Comment)  Discharge Needs  Concerns to be addressed:  Discharge Planning Concerns Readmission within the last 30 days:  No Current discharge risk:  Lives alone Barriers to Discharge:  No Barriers Identified   Trish Mage, LCSW 10/02/2018, 1:27 PM

## 2018-10-02 NOTE — Care Management (Signed)
Referral for [facility] faxed to [place].

## 2018-10-03 NOTE — Care Management (Signed)
CM has confirmed with [facility] that they are able to accept referral. Admission visit will be done on 10/03/2018.

## 2019-05-29 IMAGING — CT CT PELVIS W/O CM
2 of 3 series · 15 of 46 positions shown, 17 images · non-contrast
Comparison: Sacrum and coccyx radiographs from earlier on the same
day as well as pelvic radiographs., CT 04/18/2017

CLINICAL DATA: Pain after fall today.

EXAM:
CT PELVIS WITHOUT CONTRAST
TECHNIQUE: Multidetector CT imaging of the pelvis was performed following the
standard protocol without intravenous contrast.

[Series 3: axial st · axial · 0.72mm/px · z∈[-444,-214]mm · 12 of 133 slices shown, 14 images]
[im 9/133  soft-tissue]
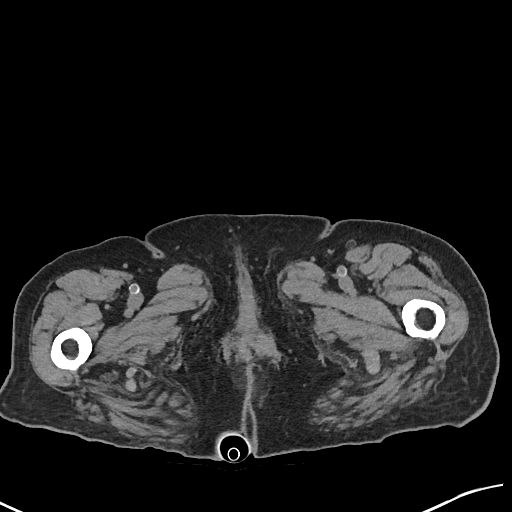
[im 9/133  bone]
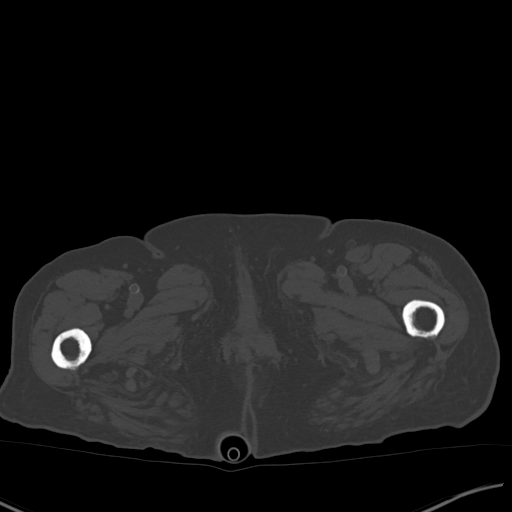
[im 18/133  soft-tissue]
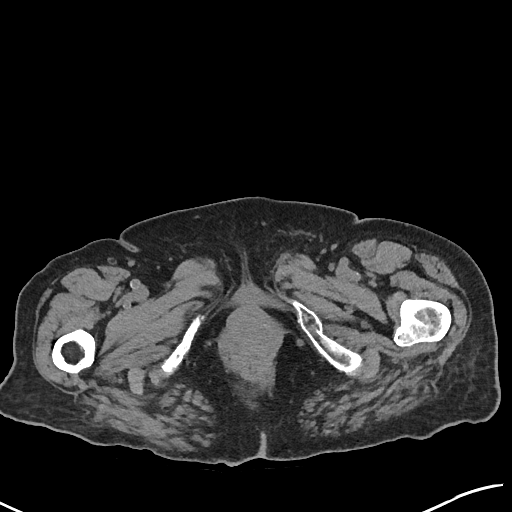
[im 30/133  soft-tissue]
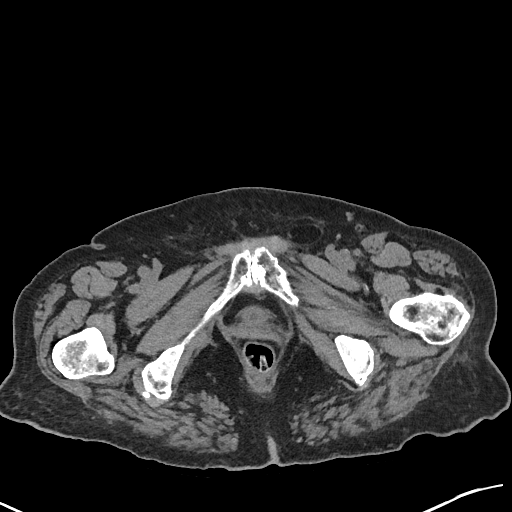
[im 39/133  soft-tissue]
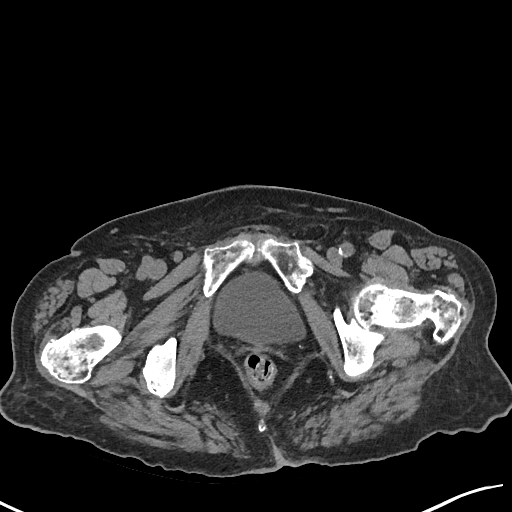
[im 52/133  soft-tissue]
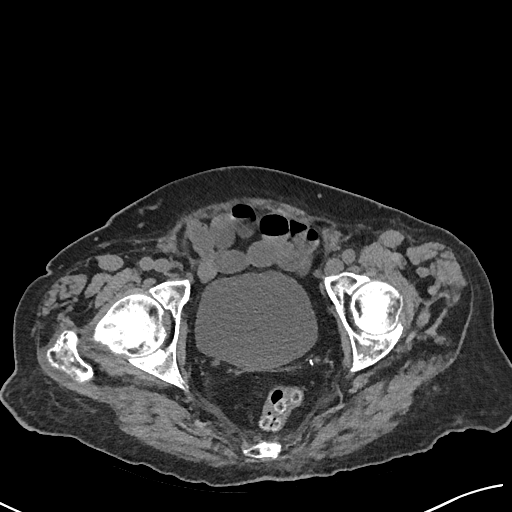
[im 60/133  soft-tissue]
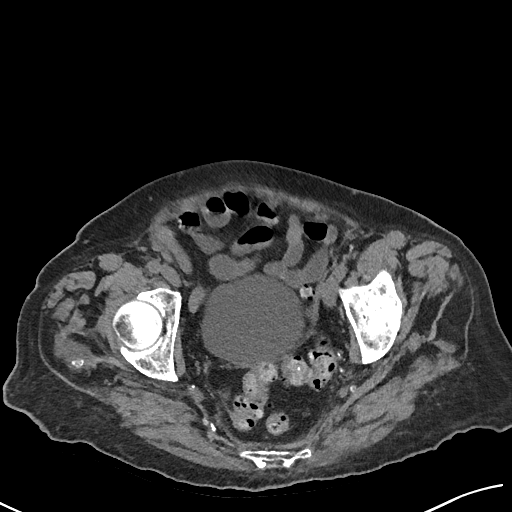
[im 73/133  soft-tissue]
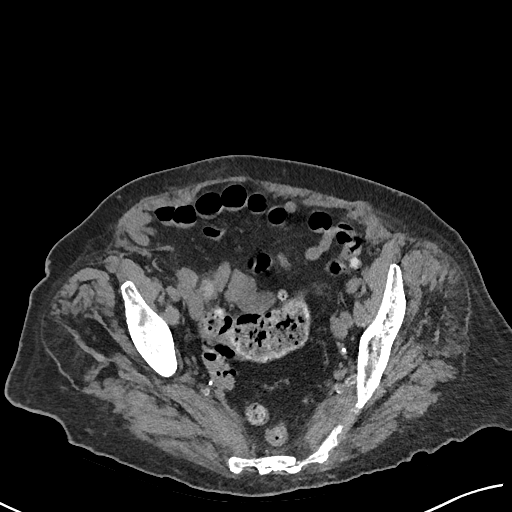
[im 81/133  soft-tissue]
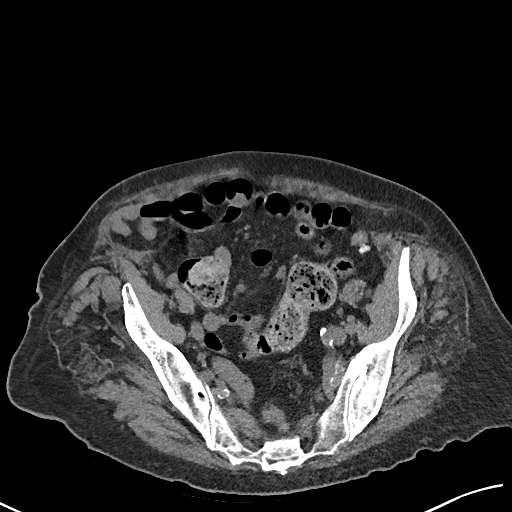
[im 94/133  soft-tissue]
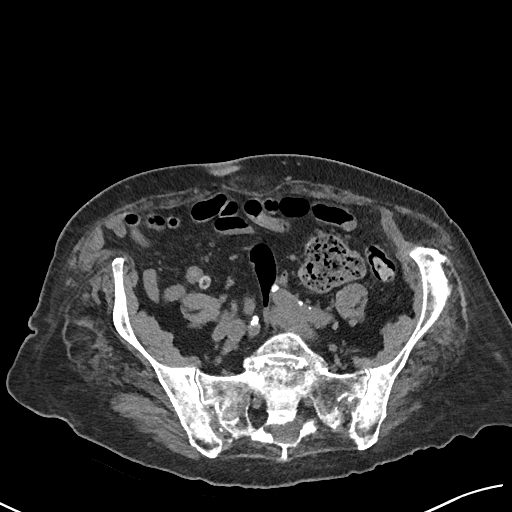
[im 94/133  bone]
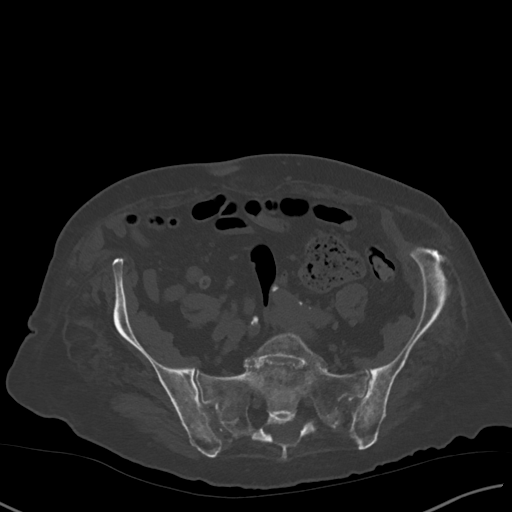
[im 103/133  soft-tissue]
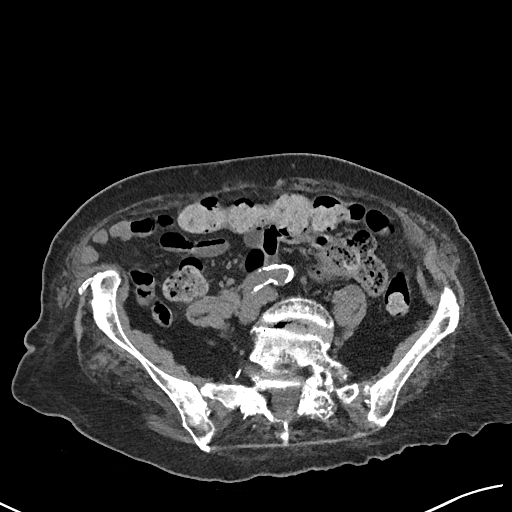
[im 115/133  soft-tissue]
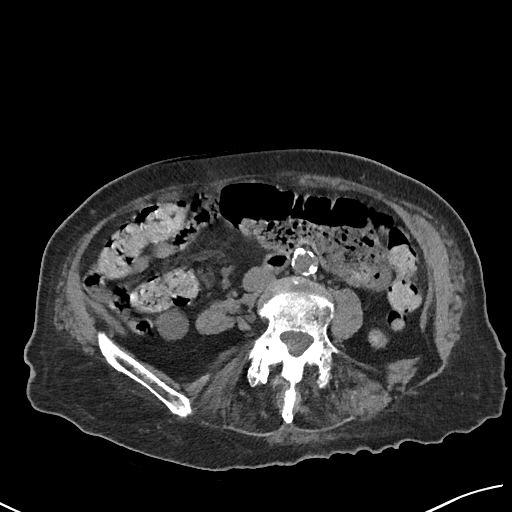
[im 124/133  soft-tissue]
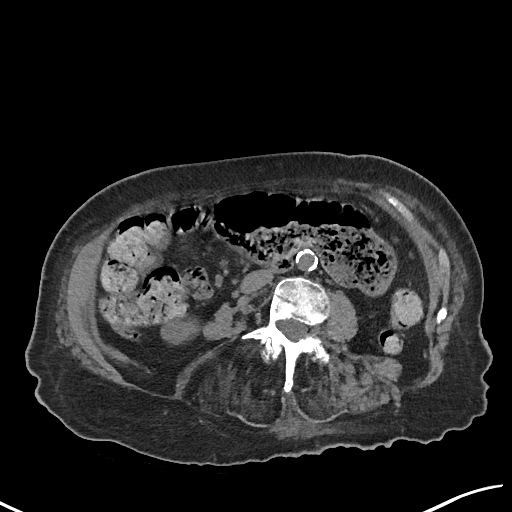

[Series 8: coronal st · coronal · 0.52mm/px · 3 of 126 slices shown]
[im 42/126  soft-tissue]
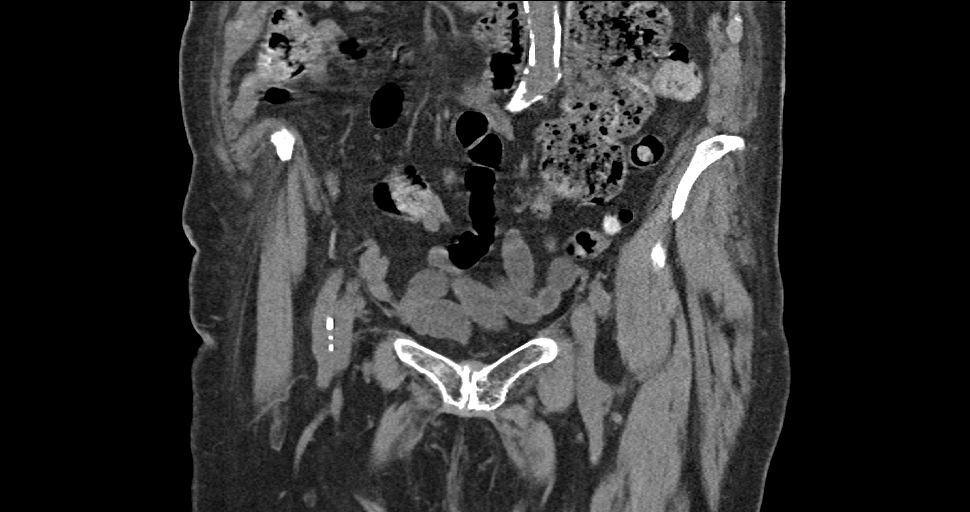
[im 56/126  soft-tissue]
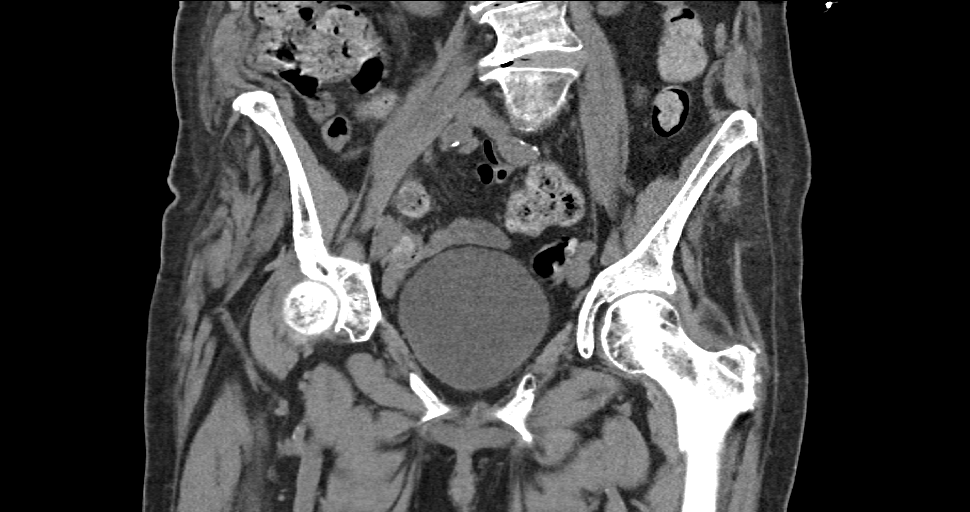
[im 70/126  soft-tissue]
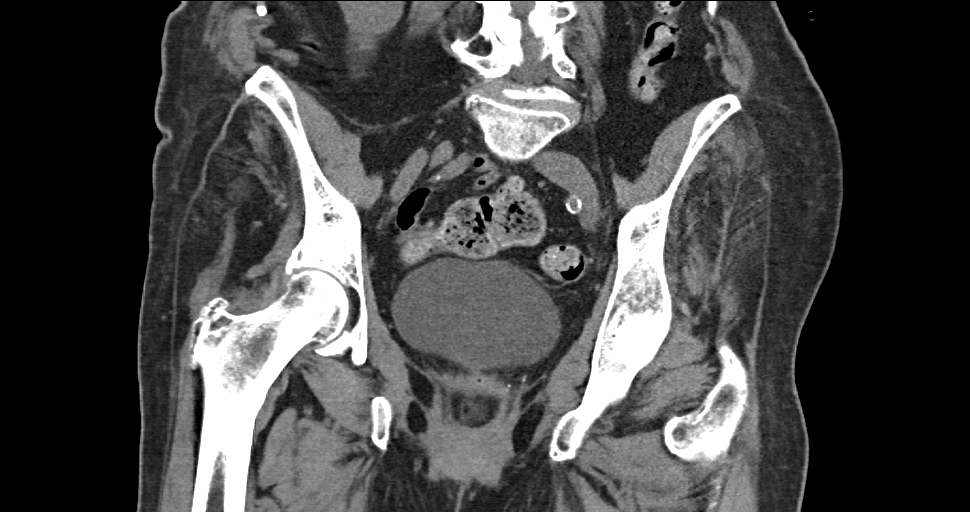

[15 of 46 positions shown; findings below may reference images not displayed]

FINDINGS: Urinary Tract: Physiologic distention of the urinary bladder without
focal mural thickening or calculus. No hydroureteronephrosis.

Bowel: Colonic diverticulosis without acute diverticulitis. Moderate
stool retention within the colon. No bowel obstruction.

Vascular/Lymphatic: Aortoiliac atherosclerosis. No lymphadenopathy.

Reproductive:  Hysterectomy. No adnexal mass.

Other:  None

Musculoskeletal: The fracture suspected involving the left S2 neural
foramen is not verified and may have represented overlying vascular
calcifications. There is however and angulated slightly impacted
fracture along the ventral cortex of S4 segment, series [DATE].
Coccyx appears intact. Mild presacral soft tissue induration without
hematoma is noted.

Degenerative disc disease of the included lower lumbar spine from L3
through S1 with anterolisthesis of L4 on L5 and L5 on S1.
IMPRESSION: 1. Mildly angulated slightly impacted fracture along the ventral
cortex of the S4 segment of the sacrum.
2. The fracture suspected involving the left S2 neural foramen is
not verified and may have represented overlying vascular
calcifications.

## 2019-12-14 DEATH — deceased
